# Patient Record
Sex: Female | Born: 1935 | Race: White | Hispanic: No | Marital: Married
Health system: Southern US, Community
[De-identification: ages and names within clinical notes are randomized; demographics above are authoritative.]

---

## 2004-12-28 ENCOUNTER — Other Ambulatory Visit: Payer: Self-pay

## 2004-12-28 ENCOUNTER — Inpatient Hospital Stay: Payer: Self-pay | Admitting: General Practice

## 2005-01-06 ENCOUNTER — Other Ambulatory Visit: Payer: Self-pay

## 2005-01-09 ENCOUNTER — Other Ambulatory Visit: Payer: Self-pay

## 2005-01-15 ENCOUNTER — Encounter: Payer: Self-pay | Admitting: Internal Medicine

## 2005-01-17 ENCOUNTER — Encounter: Payer: Self-pay | Admitting: Internal Medicine

## 2009-03-21 ENCOUNTER — Ambulatory Visit: Payer: Self-pay | Admitting: Internal Medicine

## 2009-03-30 ENCOUNTER — Emergency Department: Payer: Self-pay | Admitting: Emergency Medicine

## 2009-05-01 ENCOUNTER — Ambulatory Visit: Payer: Self-pay | Admitting: Internal Medicine

## 2009-05-03 ENCOUNTER — Ambulatory Visit: Payer: Self-pay | Admitting: Internal Medicine

## 2009-05-05 ENCOUNTER — Ambulatory Visit: Payer: Self-pay | Admitting: Internal Medicine

## 2009-05-08 ENCOUNTER — Ambulatory Visit: Payer: Self-pay | Admitting: Internal Medicine

## 2009-05-11 ENCOUNTER — Ambulatory Visit: Payer: Self-pay | Admitting: Internal Medicine

## 2009-05-15 ENCOUNTER — Ambulatory Visit: Payer: Self-pay | Admitting: Internal Medicine

## 2009-05-18 ENCOUNTER — Ambulatory Visit: Payer: Self-pay | Admitting: Internal Medicine

## 2009-05-22 ENCOUNTER — Ambulatory Visit: Payer: Self-pay | Admitting: Internal Medicine

## 2009-05-25 ENCOUNTER — Encounter: Payer: Self-pay | Admitting: Internal Medicine

## 2009-06-19 ENCOUNTER — Encounter: Payer: Self-pay | Admitting: Internal Medicine

## 2009-07-19 ENCOUNTER — Encounter: Payer: Self-pay | Admitting: Internal Medicine

## 2009-07-25 ENCOUNTER — Ambulatory Visit: Payer: Self-pay | Admitting: Internal Medicine

## 2009-08-01 ENCOUNTER — Ambulatory Visit: Payer: Self-pay | Admitting: Internal Medicine

## 2009-08-08 ENCOUNTER — Ambulatory Visit: Payer: Self-pay | Admitting: Internal Medicine

## 2009-08-15 ENCOUNTER — Ambulatory Visit: Payer: Self-pay | Admitting: Internal Medicine

## 2009-08-23 ENCOUNTER — Ambulatory Visit: Payer: Self-pay | Admitting: Internal Medicine

## 2009-09-01 ENCOUNTER — Ambulatory Visit: Payer: Self-pay | Admitting: Internal Medicine

## 2009-09-08 ENCOUNTER — Ambulatory Visit: Payer: Self-pay | Admitting: Internal Medicine

## 2009-09-15 ENCOUNTER — Ambulatory Visit: Payer: Self-pay | Admitting: Internal Medicine

## 2009-09-22 ENCOUNTER — Ambulatory Visit: Payer: Self-pay | Admitting: Internal Medicine

## 2009-10-04 ENCOUNTER — Ambulatory Visit: Payer: Self-pay | Admitting: Internal Medicine

## 2009-10-18 ENCOUNTER — Ambulatory Visit: Payer: Self-pay | Admitting: Internal Medicine

## 2009-11-01 ENCOUNTER — Ambulatory Visit: Payer: Self-pay | Admitting: Internal Medicine

## 2009-11-09 ENCOUNTER — Ambulatory Visit: Payer: Self-pay | Admitting: Internal Medicine

## 2009-11-14 ENCOUNTER — Ambulatory Visit: Payer: Self-pay | Admitting: Internal Medicine

## 2009-11-21 ENCOUNTER — Ambulatory Visit: Payer: Self-pay | Admitting: Internal Medicine

## 2009-12-26 ENCOUNTER — Ambulatory Visit: Payer: Self-pay | Admitting: Internal Medicine

## 2011-01-08 ENCOUNTER — Emergency Department: Payer: Self-pay | Admitting: Unknown Physician Specialty

## 2011-03-20 ENCOUNTER — Encounter: Payer: Self-pay | Admitting: Cardiothoracic Surgery

## 2011-03-20 ENCOUNTER — Encounter: Payer: Self-pay | Admitting: Nurse Practitioner

## 2011-09-12 ENCOUNTER — Emergency Department: Payer: Self-pay | Admitting: *Deleted

## 2011-09-12 LAB — URINALYSIS, COMPLETE
Bilirubin,UR: NEGATIVE
Blood: NEGATIVE
Glucose,UR: NEGATIVE mg/dL (ref 0–75)
Hyaline Cast: 23
Nitrite: NEGATIVE
Ph: 6 (ref 4.5–8.0)
Protein: 30
RBC,UR: 10 /HPF (ref 0–5)
Specific Gravity: 1.017 (ref 1.003–1.030)
Squamous Epithelial: 4

## 2011-09-12 LAB — CBC
MCHC: 33 g/dL (ref 32.0–36.0)
RDW: 15.2 % — ABNORMAL HIGH (ref 11.5–14.5)
WBC: 7.4 10*3/uL (ref 3.6–11.0)

## 2011-09-12 LAB — COMPREHENSIVE METABOLIC PANEL
Albumin: 3.5 g/dL (ref 3.4–5.0)
Anion Gap: 12 (ref 7–16)
BUN: 18 mg/dL (ref 7–18)
Bilirubin,Total: 0.7 mg/dL (ref 0.2–1.0)
Co2: 29 mmol/L (ref 21–32)
Glucose: 107 mg/dL — ABNORMAL HIGH (ref 65–99)
Potassium: 2.9 mmol/L — ABNORMAL LOW (ref 3.5–5.1)
SGOT(AST): 36 U/L (ref 15–37)
Total Protein: 7.7 g/dL (ref 6.4–8.2)

## 2011-09-12 LAB — TROPONIN I: Troponin-I: <0.02 ng/mL

## 2011-09-12 LAB — PROTIME-INR: INR: 1.4

## 2011-09-12 LAB — CK TOTAL AND CKMB (NOT AT ARMC)
CK, Total: 46 U/L (ref 21–215)
CK-MB: 1.7 ng/mL (ref 0.5–3.6)

## 2012-08-15 LAB — COMPREHENSIVE METABOLIC PANEL
Albumin: 3.3 g/dL — ABNORMAL LOW (ref 3.4–5.0)
Alkaline Phosphatase: 103 U/L (ref 50–136)
Anion Gap: 11 (ref 7–16)
BUN: 10 mg/dL (ref 7–18)
Calcium, Total: 8.4 mg/dL — ABNORMAL LOW (ref 8.5–10.1)
Chloride: 105 mmol/L (ref 98–107)
Glucose: 102 mg/dL — ABNORMAL HIGH (ref 65–99)
Potassium: 2.7 mmol/L — ABNORMAL LOW (ref 3.5–5.1)
SGOT(AST): 22 U/L (ref 15–37)
SGPT (ALT): 9 U/L — ABNORMAL LOW (ref 12–78)
Total Protein: 6.9 g/dL (ref 6.4–8.2)

## 2012-08-15 LAB — CBC
HGB: 11.9 g/dL — ABNORMAL LOW (ref 12.0–16.0)
MCH: 28 pg (ref 26.0–34.0)
MCHC: 34.2 g/dL (ref 32.0–36.0)
Platelet: 156 10*3/uL (ref 150–440)
RBC: 4.25 10*6/uL (ref 3.80–5.20)
RDW: 16.8 % — ABNORMAL HIGH (ref 11.5–14.5)
WBC: 13.5 10*3/uL — ABNORMAL HIGH (ref 3.6–11.0)

## 2012-08-16 ENCOUNTER — Inpatient Hospital Stay: Payer: Self-pay | Admitting: Internal Medicine

## 2012-08-16 LAB — BASIC METABOLIC PANEL
BUN: 9 mg/dL (ref 7–18)
Calcium, Total: 8 mg/dL — ABNORMAL LOW (ref 8.5–10.1)
Chloride: 105 mmol/L (ref 98–107)
Co2: 28 mmol/L (ref 21–32)
Co2: 27 mmol/L (ref 21–32)
Creatinine: 0.91 mg/dL (ref 0.60–1.30)
EGFR (African American): >60
Glucose: 169 mg/dL — ABNORMAL HIGH (ref 65–99)
Glucose: 96 mg/dL (ref 65–99)
Osmolality: 280 (ref 275–301)
Potassium: 3.4 mmol/L — ABNORMAL LOW (ref 3.5–5.1)
Sodium: 142 mmol/L (ref 136–145)

## 2012-08-16 LAB — URINALYSIS, COMPLETE
Bacteria: NONE SEEN
Bilirubin,UR: NEGATIVE
Glucose,UR: NEGATIVE mg/dL (ref 0–75)
Hyaline Cast: 8
Ketone: NEGATIVE
Leukocyte Esterase: NEGATIVE
Nitrite: NEGATIVE
Protein: 30
Specific Gravity: 1.025 (ref 1.003–1.030)
Squamous Epithelial: 3

## 2012-08-16 LAB — PRO B NATRIURETIC PEPTIDE: B-Type Natriuretic Peptide: 9161 pg/mL — ABNORMAL HIGH (ref 0–450)

## 2012-08-17 LAB — BASIC METABOLIC PANEL
Anion Gap: 8 (ref 7–16)
BUN: 9 mg/dL (ref 7–18)
Co2: 28 mmol/L (ref 21–32)
Creatinine: 0.87 mg/dL (ref 0.60–1.30)
EGFR (African American): >60
EGFR (Non-African Amer.): >60
Glucose: 84 mg/dL (ref 65–99)
Sodium: 141 mmol/L (ref 136–145)

## 2012-08-17 LAB — CBC WITH DIFFERENTIAL/PLATELET
Basophil %: 0.3 %
Eosinophil #: 0.1 10*3/uL (ref 0.0–0.7)
Eosinophil %: 1.3 %
HCT: 34.2 % — ABNORMAL LOW (ref 35.0–47.0)
HGB: 11.3 g/dL — ABNORMAL LOW (ref 12.0–16.0)
Lymphocyte #: 0.5 10*3/uL — ABNORMAL LOW (ref 1.0–3.6)
MCH: 27.1 pg (ref 26.0–34.0)
MCHC: 33.1 g/dL (ref 32.0–36.0)
MCV: 82 fL (ref 80–100)
Monocyte #: 1.2 x10 3/mm — ABNORMAL HIGH (ref 0.2–0.9)
Monocyte %: 18.2 %
Neutrophil #: 4.6 10*3/uL (ref 1.4–6.5)
Platelet: 133 10*3/uL — ABNORMAL LOW (ref 150–440)
RBC: 4.18 10*6/uL (ref 3.80–5.20)
RDW: 17.4 % — ABNORMAL HIGH (ref 11.5–14.5)

## 2012-08-17 LAB — MAGNESIUM: Magnesium: 1.9 mg/dL

## 2012-08-18 LAB — PROTIME-INR: Prothrombin Time: 30.8 secs — ABNORMAL HIGH (ref 11.5–14.7)

## 2012-08-19 LAB — BASIC METABOLIC PANEL
Anion Gap: 9 (ref 7–16)
BUN: 10 mg/dL (ref 7–18)
Calcium, Total: 8.6 mg/dL (ref 8.5–10.1)
Chloride: 101 mmol/L (ref 98–107)
Creatinine: 0.96 mg/dL (ref 0.60–1.30)
EGFR (African American): >60
Glucose: 140 mg/dL — ABNORMAL HIGH (ref 65–99)
Osmolality: 279 (ref 275–301)
Potassium: 3.8 mmol/L (ref 3.5–5.1)

## 2012-08-19 LAB — PROTIME-INR: INR: 3

## 2012-08-21 LAB — CULTURE, BLOOD (SINGLE)

## 2013-02-15 ENCOUNTER — Inpatient Hospital Stay: Payer: Self-pay | Admitting: Orthopedic Surgery

## 2013-02-15 LAB — URINALYSIS, COMPLETE
Bilirubin,UR: NEGATIVE
Blood: NEGATIVE
Glucose,UR: NEGATIVE mg/dL (ref 0–75)
Hyaline Cast: 8
Ketone: NEGATIVE
Nitrite: NEGATIVE
Ph: 6 (ref 4.5–8.0)
Protein: NEGATIVE
RBC,UR: 1 /HPF (ref 0–5)
Specific Gravity: 1.008 (ref 1.003–1.030)
Squamous Epithelial: NONE SEEN

## 2013-02-15 LAB — COMPREHENSIVE METABOLIC PANEL
Albumin: 3.4 g/dL (ref 3.4–5.0)
Anion Gap: 6 — ABNORMAL LOW (ref 7–16)
Calcium, Total: 8.7 mg/dL (ref 8.5–10.1)
Co2: 31 mmol/L (ref 21–32)
Creatinine: 0.79 mg/dL (ref 0.60–1.30)
EGFR (African American): >60
Glucose: 122 mg/dL — ABNORMAL HIGH (ref 65–99)
Potassium: 3.8 mmol/L (ref 3.5–5.1)
SGPT (ALT): 19 U/L (ref 12–78)
Sodium: 140 mmol/L (ref 136–145)
Total Protein: 6.8 g/dL (ref 6.4–8.2)

## 2013-02-15 LAB — PROTIME-INR
INR: 2.7
Prothrombin Time: 27.7 secs — ABNORMAL HIGH (ref 11.5–14.7)

## 2013-02-15 LAB — CBC WITH DIFFERENTIAL/PLATELET
Basophil #: 0 10*3/uL (ref 0.0–0.1)
Eosinophil #: 0.1 10*3/uL (ref 0.0–0.7)
Eosinophil %: 0.5 %
HCT: 33.8 % — ABNORMAL LOW (ref 35.0–47.0)
HGB: 11.1 g/dL — ABNORMAL LOW (ref 12.0–16.0)
Lymphocyte #: 0.7 10*3/uL — ABNORMAL LOW (ref 1.0–3.6)
MCH: 27.8 pg (ref 26.0–34.0)
MCV: 85 fL (ref 80–100)
Monocyte #: 2.2 x10 3/mm — ABNORMAL HIGH (ref 0.2–0.9)
Monocyte %: 19.6 %
Neutrophil #: 8.1 10*3/uL — ABNORMAL HIGH (ref 1.4–6.5)
RBC: 3.98 10*6/uL (ref 3.80–5.20)
RDW: 16.7 % — ABNORMAL HIGH (ref 11.5–14.5)
WBC: 11 10*3/uL (ref 3.6–11.0)

## 2013-02-15 LAB — T4, FREE: Free Thyroxine: 1.99 ng/dL — ABNORMAL HIGH (ref 0.76–1.46)

## 2013-02-15 LAB — TSH: Thyroid Stimulating Horm: 0.012 u[IU]/mL — ABNORMAL LOW

## 2013-02-16 ENCOUNTER — Ambulatory Visit: Payer: Self-pay | Admitting: Internal Medicine

## 2013-02-16 LAB — BASIC METABOLIC PANEL
BUN: 9 mg/dL (ref 7–18)
Chloride: 102 mmol/L (ref 98–107)
Creatinine: 0.88 mg/dL (ref 0.60–1.30)
EGFR (African American): >60
Glucose: 109 mg/dL — ABNORMAL HIGH (ref 65–99)
Potassium: 3.8 mmol/L (ref 3.5–5.1)
Sodium: 139 mmol/L (ref 136–145)

## 2013-02-16 LAB — CBC WITH DIFFERENTIAL/PLATELET
Basophil #: 0 10*3/uL (ref 0.0–0.1)
Basophil %: 0.1 %
Eosinophil #: 0 10*3/uL (ref 0.0–0.7)
Eosinophil %: 0.2 %
Lymphocyte %: 2.9 %
MCH: 28.2 pg (ref 26.0–34.0)
MCHC: 33.1 g/dL (ref 32.0–36.0)
MCV: 85 fL (ref 80–100)
Monocyte #: 3.8 x10 3/mm — ABNORMAL HIGH (ref 0.2–0.9)
Monocyte %: 26.4 %
Platelet: 155 10*3/uL (ref 150–440)
RBC: 3.42 10*6/uL — ABNORMAL LOW (ref 3.80–5.20)
RDW: 16.3 % — ABNORMAL HIGH (ref 11.5–14.5)
WBC: 14.3 10*3/uL — ABNORMAL HIGH (ref 3.6–11.0)

## 2013-02-16 LAB — URINALYSIS, COMPLETE
Bilirubin,UR: NEGATIVE
Ketone: NEGATIVE
Nitrite: NEGATIVE
Ph: 5 (ref 4.5–8.0)
Protein: 100
Specific Gravity: 1.017 (ref 1.003–1.030)
Squamous Epithelial: NONE SEEN

## 2013-02-16 LAB — PROTIME-INR
INR: 2.3
INR: 1.6
Prothrombin Time: 19.2 secs — ABNORMAL HIGH (ref 11.5–14.7)

## 2013-02-17 LAB — CBC WITH DIFFERENTIAL/PLATELET
Bands: 1 %
Lymphocytes: 1 %
MCHC: 33.7 g/dL (ref 32.0–36.0)
MCV: 85 fL (ref 80–100)
Monocytes: 15 %
Platelet: 172 10*3/uL (ref 150–440)
RBC: 3.17 10*6/uL — ABNORMAL LOW (ref 3.80–5.20)
Segmented Neutrophils: 83 %
WBC: 19.3 10*3/uL — ABNORMAL HIGH (ref 3.6–11.0)

## 2013-02-17 LAB — PROTIME-INR
INR: 1.7
INR: 1.5
Prothrombin Time: 19.7 secs — ABNORMAL HIGH (ref 11.5–14.7)
Prothrombin Time: 17.8 secs — ABNORMAL HIGH (ref 11.5–14.7)

## 2013-02-17 LAB — BASIC METABOLIC PANEL
BUN: 15 mg/dL (ref 7–18)
Co2: 25 mmol/L (ref 21–32)
Creatinine: 1.21 mg/dL (ref 0.60–1.30)
Glucose: 88 mg/dL (ref 65–99)

## 2013-02-18 LAB — BASIC METABOLIC PANEL
Anion Gap: 11 (ref 7–16)
Chloride: 105 mmol/L (ref 98–107)
Co2: 27 mmol/L (ref 21–32)
EGFR (African American): 47 — ABNORMAL LOW
EGFR (Non-African Amer.): 40 — ABNORMAL LOW
Glucose: 93 mg/dL (ref 65–99)
Osmolality: 289 (ref 275–301)

## 2013-02-18 LAB — CBC WITH DIFFERENTIAL/PLATELET
HGB: 7.7 g/dL — ABNORMAL LOW (ref 12.0–16.0)
MCH: 28.8 pg (ref 26.0–34.0)
Monocytes: 15 %
Platelet: 147 10*3/uL — ABNORMAL LOW (ref 150–440)
RBC: 2.69 10*6/uL — ABNORMAL LOW (ref 3.80–5.20)
Segmented Neutrophils: 84 %
WBC: 20.5 10*3/uL — ABNORMAL HIGH (ref 3.6–11.0)

## 2013-02-18 LAB — PROTIME-INR: INR: 2

## 2013-02-19 LAB — CBC WITH DIFFERENTIAL/PLATELET
Bands: 1 %
Basophil #: 0 10*3/uL (ref 0.0–0.1)
Basophil %: 0.1 %
Eosinophil #: 0.1 10*3/uL (ref 0.0–0.7)
Eosinophil: 1 %
HGB: 9.5 g/dL — ABNORMAL LOW (ref 12.0–16.0)
Lymphocyte %: 4.3 %
MCH: 30.2 pg (ref 26.0–34.0)
MCH: 29.5 pg (ref 26.0–34.0)
MCHC: 34.2 g/dL (ref 32.0–36.0)
MCHC: 33.3 g/dL (ref 32.0–36.0)
MCV: 88 fL (ref 80–100)
Monocyte #: 5.5 x10 3/mm — ABNORMAL HIGH (ref 0.2–0.9)
Monocyte %: 29.8 %
Neutrophil #: 12 10*3/uL — ABNORMAL HIGH (ref 1.4–6.5)
Neutrophil %: 65.5 %
Platelet: 154 10*3/uL (ref 150–440)
Platelet: 191 10*3/uL (ref 150–440)
RBC: 3.31 10*6/uL — ABNORMAL LOW (ref 3.80–5.20)
RDW: 17.2 % — ABNORMAL HIGH (ref 11.5–14.5)
RDW: 17 % — ABNORMAL HIGH (ref 11.5–14.5)
Segmented Neutrophils: 66 %
WBC: 18.5 10*3/uL — ABNORMAL HIGH (ref 3.6–11.0)
WBC: 22.6 10*3/uL — ABNORMAL HIGH (ref 3.6–11.0)

## 2013-02-19 LAB — PROTIME-INR: Prothrombin Time: 20 secs — ABNORMAL HIGH (ref 11.5–14.7)

## 2013-02-19 LAB — APTT: Activated PTT: 36.3 secs — ABNORMAL HIGH (ref 23.6–35.9)

## 2013-02-20 LAB — CBC WITH DIFFERENTIAL/PLATELET
Basophil %: 0.2 %
Eosinophil %: 0.3 %
HCT: 28.9 % — ABNORMAL LOW (ref 35.0–47.0)
HGB: 9.5 g/dL — ABNORMAL LOW (ref 12.0–16.0)
MCH: 29.3 pg (ref 26.0–34.0)
MCHC: 33 g/dL (ref 32.0–36.0)
MCV: 89 fL (ref 80–100)
Monocyte %: 27.8 %
Neutrophil #: 16.6 10*3/uL — ABNORMAL HIGH (ref 1.4–6.5)
Platelet: 211 10*3/uL (ref 150–440)
RDW: 17.5 % — ABNORMAL HIGH (ref 11.5–14.5)

## 2013-02-20 LAB — URINALYSIS, COMPLETE
Bacteria: NONE SEEN
Bilirubin,UR: NEGATIVE
Hyaline Cast: 3
Ketone: NEGATIVE
Protein: NEGATIVE
Specific Gravity: 1.012 (ref 1.003–1.030)
WBC UR: <1 /HPF (ref 0–5)

## 2013-02-20 LAB — PROTIME-INR: INR: 1.3

## 2013-02-20 LAB — APTT: Activated PTT: >160 secs (ref 23.6–35.9)

## 2013-02-20 LAB — CLOSTRIDIUM DIFFICILE BY PCR

## 2013-02-21 LAB — CBC WITH DIFFERENTIAL/PLATELET
Bands: 1 %
Comment - H1-Com3: NORMAL
HCT: 26.7 % — ABNORMAL LOW (ref 35.0–47.0)
HGB: 8 g/dL — ABNORMAL LOW (ref 12.0–16.0)
HGB: 9 g/dL — ABNORMAL LOW (ref 12.0–16.0)
Lymphocytes: 11 %
MCH: 30 pg (ref 26.0–34.0)
MCHC: 33.6 g/dL (ref 32.0–36.0)
MCV: 89 fL (ref 80–100)
Metamyelocyte: 1 %
Monocytes: 13 %
Myelocyte: 1 %
Myelocyte: 2 %
Platelet: 159 10*3/uL (ref 150–440)
Platelet: 195 10*3/uL (ref 150–440)
RDW: 18 % — ABNORMAL HIGH (ref 11.5–14.5)
RDW: 18 % — ABNORMAL HIGH (ref 11.5–14.5)
Segmented Neutrophils: 74 %
WBC: 10.9 10*3/uL (ref 3.6–11.0)
WBC: 14.1 10*3/uL — ABNORMAL HIGH (ref 3.6–11.0)

## 2013-02-21 LAB — APTT
Activated PTT: 122.5 secs — ABNORMAL HIGH (ref 23.6–35.9)
Activated PTT: 45.2 secs — ABNORMAL HIGH (ref 23.6–35.9)

## 2013-02-22 LAB — CBC WITH DIFFERENTIAL/PLATELET
Bands: 1 %
Eosinophil: 2 %
HCT: 24 % — ABNORMAL LOW (ref 35.0–47.0)
HGB: 7.9 g/dL — ABNORMAL LOW (ref 12.0–16.0)
Lymphocytes: 14 %
MCH: 29.3 pg (ref 26.0–34.0)
MCHC: 33 g/dL (ref 32.0–36.0)
MCV: 89 fL (ref 80–100)
NRBC/100 WBC: 1 /
Platelet: 166 10*3/uL (ref 150–440)
RBC: 2.7 10*6/uL — ABNORMAL LOW (ref 3.80–5.20)
WBC: 11.8 10*3/uL — ABNORMAL HIGH (ref 3.6–11.0)

## 2013-02-22 LAB — BASIC METABOLIC PANEL
Calcium, Total: 7.8 mg/dL — ABNORMAL LOW (ref 8.5–10.1)
Co2: 34 mmol/L — ABNORMAL HIGH (ref 21–32)
Creatinine: 0.75 mg/dL (ref 0.60–1.30)
EGFR (African American): >60
Glucose: 97 mg/dL (ref 65–99)
Osmolality: 279 (ref 275–301)
Sodium: 139 mmol/L (ref 136–145)

## 2013-02-22 LAB — PROTIME-INR: INR: 1.3

## 2013-02-23 LAB — CBC WITH DIFFERENTIAL/PLATELET
Basophil #: 0 10*3/uL (ref 0.0–0.1)
Basophil %: 0.1 %
Eosinophil %: 0.6 %
HCT: 26 % — ABNORMAL LOW (ref 35.0–47.0)
HGB: 8.6 g/dL — ABNORMAL LOW (ref 12.0–16.0)
Lymphocyte %: 7.2 %
MCHC: 32.9 g/dL (ref 32.0–36.0)
Monocyte #: 3.2 x10 3/mm — ABNORMAL HIGH (ref 0.2–0.9)
Monocyte %: 23.3 %
Neutrophil %: 68.8 %
RDW: 18.8 % — ABNORMAL HIGH (ref 11.5–14.5)
WBC: 13.8 10*3/uL — ABNORMAL HIGH (ref 3.6–11.0)

## 2013-02-23 LAB — PATHOLOGY REPORT

## 2013-02-23 LAB — PROTIME-INR: Prothrombin Time: 17 secs — ABNORMAL HIGH (ref 11.5–14.7)

## 2013-02-24 ENCOUNTER — Encounter: Payer: Self-pay | Admitting: Internal Medicine

## 2013-02-25 ENCOUNTER — Ambulatory Visit: Payer: Self-pay | Admitting: Internal Medicine

## 2013-02-25 LAB — PROTIME-INR
INR: 1.5
Prothrombin Time: 17.9 secs — ABNORMAL HIGH (ref 11.5–14.7)

## 2013-02-25 LAB — CULTURE, BLOOD (SINGLE)

## 2013-02-26 ENCOUNTER — Ambulatory Visit: Payer: Self-pay | Admitting: Gerontology

## 2013-02-26 LAB — CBC WITH DIFFERENTIAL/PLATELET
HCT: 30.1 % — ABNORMAL LOW (ref 35.0–47.0)
Lymphocytes: 3 %
MCH: 28.5 pg (ref 26.0–34.0)
MCHC: 31.8 g/dL — ABNORMAL LOW (ref 32.0–36.0)
NRBC/100 WBC: 1 /
Segmented Neutrophils: 68 %
Variant Lymphocyte - H1-Rlymph: 2 %
WBC: 18.7 10*3/uL — ABNORMAL HIGH (ref 3.6–11.0)

## 2013-02-26 LAB — COMPREHENSIVE METABOLIC PANEL
Albumin: 2.8 g/dL — ABNORMAL LOW (ref 3.4–5.0)
Alkaline Phosphatase: 151 U/L — ABNORMAL HIGH (ref 50–136)
Bilirubin,Total: 1.5 mg/dL — ABNORMAL HIGH (ref 0.2–1.0)
Calcium, Total: 8.7 mg/dL (ref 8.5–10.1)
Co2: 33 mmol/L — ABNORMAL HIGH (ref 21–32)
Creatinine: 0.86 mg/dL (ref 0.60–1.30)
EGFR (African American): >60
EGFR (Non-African Amer.): >60
Osmolality: 270 (ref 275–301)
Potassium: 3.7 mmol/L (ref 3.5–5.1)
SGOT(AST): 36 U/L (ref 15–37)
Total Protein: 6.3 g/dL — ABNORMAL LOW (ref 6.4–8.2)

## 2013-02-26 LAB — URINALYSIS, COMPLETE
Bilirubin,UR: NEGATIVE
Blood: NEGATIVE
Hyaline Cast: 48
Ketone: NEGATIVE
RBC,UR: 1 /HPF (ref 0–5)
Specific Gravity: 1.018 (ref 1.003–1.030)
WBC UR: 10 /HPF (ref 0–5)

## 2013-02-26 LAB — PROTIME-INR
INR: 1.3
Prothrombin Time: 16.4 secs — ABNORMAL HIGH (ref 11.5–14.7)

## 2013-03-02 LAB — PROTIME-INR: INR: 2.3

## 2013-03-09 LAB — PROTIME-INR: INR: 4.1

## 2013-03-15 LAB — PROTIME-INR: Prothrombin Time: 40.6 secs — ABNORMAL HIGH (ref 11.5–14.7)

## 2013-03-18 LAB — PROTIME-INR: INR: 2.4

## 2013-03-19 ENCOUNTER — Ambulatory Visit: Payer: Self-pay | Admitting: Internal Medicine

## 2013-03-21 LAB — COMPREHENSIVE METABOLIC PANEL
Anion Gap: 6 — ABNORMAL LOW (ref 7–16)
Calcium, Total: 8.2 mg/dL — ABNORMAL LOW (ref 8.5–10.1)
Chloride: 101 mmol/L (ref 98–107)
Co2: 33 mmol/L — ABNORMAL HIGH (ref 21–32)
Creatinine: 0.59 mg/dL — ABNORMAL LOW (ref 0.60–1.30)
Glucose: 91 mg/dL (ref 65–99)
Osmolality: 277 (ref 275–301)

## 2013-03-21 LAB — CBC WITH DIFFERENTIAL/PLATELET
Basophil #: 0 10*3/uL (ref 0.0–0.1)
Basophil %: 0.6 %
Eosinophil #: 0.1 10*3/uL (ref 0.0–0.7)
Eosinophil %: 0.7 %
HGB: 11.6 g/dL — ABNORMAL LOW (ref 12.0–16.0)
Lymphocyte %: 9.5 %
MCH: 30.6 pg (ref 26.0–34.0)
MCHC: 33.4 g/dL (ref 32.0–36.0)
MCV: 91 fL (ref 80–100)
Monocyte #: 2 x10 3/mm — ABNORMAL HIGH (ref 0.2–0.9)
Monocyte %: 27.1 %
Neutrophil #: 4.6 10*3/uL (ref 1.4–6.5)
Neutrophil %: 62.1 %
Platelet: 210 10*3/uL (ref 150–440)
RBC: 3.81 10*6/uL (ref 3.80–5.20)
WBC: 7.3 10*3/uL (ref 3.6–11.0)

## 2013-03-21 LAB — PROTIME-INR
INR: 2.5
Prothrombin Time: 26.7 secs — ABNORMAL HIGH (ref 11.5–14.7)

## 2013-03-22 ENCOUNTER — Observation Stay: Payer: Self-pay | Admitting: Internal Medicine

## 2013-03-22 LAB — URINALYSIS, COMPLETE
Bilirubin,UR: NEGATIVE
Glucose,UR: NEGATIVE mg/dL (ref 0–75)
Ph: 5 (ref 4.5–8.0)
Protein: 30
RBC,UR: 58 /HPF (ref 0–5)
Specific Gravity: 1.029 (ref 1.003–1.030)
Squamous Epithelial: 1
WBC UR: 22 /HPF (ref 0–5)

## 2013-03-22 LAB — TSH: Thyroid Stimulating Horm: 42.4 u[IU]/mL — ABNORMAL HIGH

## 2013-03-22 LAB — LIPASE, BLOOD: Lipase: 124 U/L (ref 73–393)

## 2013-03-24 ENCOUNTER — Encounter: Payer: Self-pay | Admitting: Internal Medicine

## 2013-03-25 LAB — BASIC METABOLIC PANEL
Anion Gap: 4 — ABNORMAL LOW (ref 7–16)
BUN: 8 mg/dL (ref 7–18)
Calcium, Total: 8.3 mg/dL — ABNORMAL LOW (ref 8.5–10.1)
Chloride: 105 mmol/L (ref 98–107)
Co2: 30 mmol/L (ref 21–32)
EGFR (African American): >60
EGFR (Non-African Amer.): >60
Glucose: 72 mg/dL (ref 65–99)
Osmolality: 274 (ref 275–301)
Potassium: 3.9 mmol/L (ref 3.5–5.1)
Sodium: 139 mmol/L (ref 136–145)

## 2013-03-25 LAB — TSH: Thyroid Stimulating Horm: 43.6 u[IU]/mL — ABNORMAL HIGH

## 2013-03-25 LAB — PROTIME-INR: INR: 2.8

## 2013-03-26 ENCOUNTER — Ambulatory Visit: Payer: Self-pay | Admitting: Gerontology

## 2013-03-29 LAB — URINALYSIS, COMPLETE
Bilirubin,UR: NEGATIVE
Blood: NEGATIVE
Glucose,UR: NEGATIVE mg/dL (ref 0–75)
Leukocyte Esterase: NEGATIVE
Nitrite: NEGATIVE
Ph: 5 (ref 4.5–8.0)
Protein: 30
RBC,UR: NONE SEEN /HPF (ref 0–5)
Specific Gravity: 1.024 (ref 1.003–1.030)
Squamous Epithelial: NONE SEEN

## 2013-04-01 LAB — PROTIME-INR: INR: 7.5

## 2013-04-01 LAB — TSH: Thyroid Stimulating Horm: 42 u[IU]/mL — ABNORMAL HIGH

## 2013-04-02 LAB — CBC WITH DIFFERENTIAL/PLATELET
Basophil #: 0 10*3/uL (ref 0.0–0.1)
Basophil %: 0.4 %
Eosinophil #: 0 10*3/uL (ref 0.0–0.7)
Eosinophil %: 0.9 %
HCT: 33.8 % — ABNORMAL LOW (ref 35.0–47.0)
HGB: 11.3 g/dL — ABNORMAL LOW (ref 12.0–16.0)
Lymphocyte #: 0.5 10*3/uL — ABNORMAL LOW (ref 1.0–3.6)
MCH: 30.7 pg (ref 26.0–34.0)
MCV: 92 fL (ref 80–100)
Monocyte %: 22.5 %
Neutrophil %: 65.6 %
Platelet: 276 10*3/uL (ref 150–440)
WBC: 4.8 10*3/uL (ref 3.6–11.0)

## 2013-04-02 LAB — PROTIME-INR
INR: >15.1
Prothrombin Time: >120 secs (ref 11.5–14.7)

## 2013-04-03 LAB — PROTIME-INR: INR: 3.2

## 2013-04-05 LAB — PROTIME-INR: INR: 2.6

## 2013-04-19 DEATH — deceased

## 2014-12-06 NOTE — H&P (Signed)
PATIENT NAME:  Christy Webster, Christy Webster MR#:  [id number] DATE OF BIRTH:  1929-02-28  DATE OF ADMISSION:  08/16/2012  PRIMARY CARE PHYSICIAN:  Bethann PunchesMark Miller, MD.  REFERRING PHYSICIAN:  Lucrezia EuropeWebster Allison, MD.  CHIEF COMPLAINT: Cough, congestion, shortness of breath.   HISTORY OF PRESENT ILLNESS: This is an 81 year old female with significant past medical history of hypertension, hyperlipidemia, atrial fibrillation on warfarin, history of aortic valve replacement in 1989 with St. Jude valve in [place], with known history of diastolic CHF.  Has been managed by Dr. Gwen PoundsKowalski with recent echo done in November of this year showing ejection fraction of 70% with severe biatrial enlargement and moderate to severe pulmonary hypertension. The patient presents with complaints of cough, congestion and shortness of breath. The patient reports she has been coughing for a few days and feeling thick secretions that she cannot cough out. As well she complains of shortness of breath which she reports is mainly chronic exertional.  As well she complaints of worsening lower extremity edema. The patient's chest x-ray did not show any infiltrate, but did show evidence of cardiomegaly with increased vascular congestion. The patient had significantly elevated proBNP at 9161. As well she did have significant hypokalemia with potassium of 2.7.  The patient as well had low O2 sat at 92% on room air, currently improved at 97% on 2 liters nasal cannula. The patient does not use oxygen at home and does not have history of COPD in the past. The patient denies any chest pain, any vomiting, any nausea, any coffee-ground emesis. The patient reports her shortness of breath improved after receiving IV Lasix.  Hospitalist service was requested to admit the patient for further management and work-up of her congestive heart failure.   PAST MEDICAL HISTORY:  1.  Arthritis. 2.  Hypertension.  3.  Osteoporosis.  4.  Right hip replacement in 2006.  5.   Hyperlipidemia.  6.  Aortic valve replacement with St. Jude valve  in 1989 in [place]. 7.  Hypothyroidism.  8.  Congestive heart failure with ejection fraction of 70% with diastolic dysfunction.   SOCIAL HISTORY: The patient does not smoke, drinks wine occasionally. Lives at assisted living facility.   FAMILY HISTORY: Significant for coronary artery disease.   ALLERGIES: No known drug allergies.   HOME MEDICATIONS: 1.  Pravastatin 40 mg oral daily. 2.  Synthroid 125 mcg oral daily.  3.  Bisoprolol 5 mg oral daily.  4.  Folic acid 1 mg oral daily.  5.  Dyazide 37.5/25 mg half capsule daily.  6.  Multivitamin 1 tablet daily.  7.  Vitamin D3, 2000 units oral daily.  8.  Remeron 15 mg oral at bedtime.  9.  Lasix 20 mg oral daily.   REVIEW OF SYSTEMS:  The patient denies any fever or chills. Complains of fatigue and weakness.  EYES: Denies blurry vision, double vision or pain.  ENT: Denies tinnitus, ear pain, hearing loss, epistaxis or discharge.  RESPIRATORY: Complains of cough. Denies any wheezing, hemoptysis, painful respirations. Has complaints of dyspnea.  CARDIOVASCULAR: Denies chest pain, orthopnea, palpitations, syncope. Has complaints of lower extremity edema.  GASTROINTESTINAL: Denies nausea, vomiting, diarrhea, abdominal pain, hematemesis, melena.  GENITOURINARY: Denies dysuria, hematuria, renal colic.  ENDOCRINE:  Denies polyuria, polydipsia, heat or cold intolerance.  HEMATOLOGY: Denies anemia, easy bruising, bleeding diathesis.  INTEGUMENTARY: Denies any acne, rash or skin lesions. Complains of easy bruising.  MUSCULOSKELETAL: Complains of lower back pain and arthritis. Denies any cramps or gout.  NEUROLOGIC: Denies  ataxia, dementia, migraine, CVA or seizures.  PSYCHIATRIC: Denies anxiety, insomnia, schizophrenia or bipolar disorder.   PHYSICAL EXAMINATION: VITAL SIGNS: Temperature 97.8, pulse 98, respiratory rate 17, blood pressure 168/99, saturating 98% on 2  liters nasal cannula.  GENERAL: Frail, elderly female who looks comfortable in bed in no apparent distress.  HEENT: Head atraumatic, normocephalic. Pupils are to light. Pink conjunctivae. Anicteric sclerae. Moist oral mucosa.  NECK: Supple. No thyromegaly. No JVD.  CHEST: Good air entry bilaterally. No wheezing, rales, rhonchi.  CARDIOVASCULAR: S1, S2 heard with mild systolic murmur present with prosthetic heart sound.  ABDOMEN: Soft, nontender, nondistended. Bowel sounds present.  EXTREMITIES: +2 edema bilaterally. No clubbing. No cyanosis. Good pulses bilaterally.  NEUROLOGIC: Awake, alert x 3. Intact judgment and insight. Motor 5/5 in all extremities.  PSYCHIATRIC: Appropriate affect x 3.  Intact judgment and insight.  SKIN: Has multiple senile purpura.  Normal skin turgor. Warm and dry.   LABORATORY AND DIAGNOSTIC DATA:  Glucose 102. ProBNP 9161. BUN 10, creatinine 0.92, sodium 141, potassium 2.7, chloride 105, CO2 25,  anion gap 11.  White blood cells 13.5, hemoglobin 11.9, hematocrit 34.7, platelets 156. Urinalysis negative. EKG showing no changes from previous EKG with right bundle branch block which is old, showing irregular rhythm,  most likely A. fib.   ASSESSMENT AND PLAN: This is an 81 year old female who presents with complaint of cough, congestion, shortness of breath due to acute congestive heart failure and acute bronchitis.   1.  Acute congestive heart failure. The patient had recent echo showing ejection fraction of 70% with diastolic dysfunction. The patient will be started on IV Lasix.  Will be on strict ins and outs with daily weights and p.r.n. oxygen. Will cycle the patient's troponin and will consult Dr. Gwen Pounds as the patient is known to him.   2.  Hypokalemia. Will replace. Will recheck in a.Webster.  3.  History of atrial fibrillation. Currently the patient is on anticoagulation with INR of 2.7.  4.  Acute bronchitis.  The patient will be started on Augmentin and Mucinex.   5.  History of hypertension, uncontrolled. We will resume home medication.  6.  Hyperlipidemia. Continue with statin.  7.  Hypothyroidism. Continue with Synthroid.  8.  Deep vein thrombosis prophylaxis. The patient is on full dose anticoagulation with warfarin, no need for further chemical anticoagulation.   CODE STATUS: The patient is full code.   Total time spent on admission and patient care: 55 minutes.    ____________________________ Starleen Arms, MD dse:ct D: 08/16/2012 03:28:27 ET T: 08/17/2012 07:34:10 ET JOB#: [id number]  cc: Starleen Arms, MD, <Dictator> [place] Teena Irani MD ELECTRONICALLY SIGNED 08/18/2012 7:21

## 2014-12-09 NOTE — H&P (Signed)
PATIENT NAME:  Christy Webster, Christy Webster MR#:  [id number] DATE OF BIRTH:  Dec 22, 1928  DATE OF ADMISSION:  03/22/2013  PRIMARY CARE PHYSICIAN: Dr. Bethann PunchesMark Miller.   REFERRING PHYSICIAN: Noelle McLaurin.   CHIEF COMPLAINT: The patient referred from the rehabilitation center with complaint of constipation, abdominal pain and vomiting.   HISTORY OF PRESENT ILLNESS: Mrs. Christy Webster is an 81 year old Caucasian female recently admitted to the hospital on June 30th and discharged on July 8th when she sustained mechanical fall, resulting in left femoral neck fracture. This was operated on with left total hip replacement. The patient was sent for rehabilitation and placed on narcotics for pain control. She is receiving oxycodone in addition to fentanyl patch. The patient is complaining of constipation, and yesterday, she started to have abdominal pain associated with vomiting. The patient was brought here to the Emergency Department for evaluation, and CAT scan revealed severe constipation with fecal impaction. Digital disimpaction by Emergency Room physician had failed since she could not reach the impacted site. The patient is now in the process to be admitted for observation and to start treating her problem.   REVIEW OF SYSTEMS:  CONSTITUTIONAL: Denies having any fever. No chills. No fatigue.  EYES: No blurring of vision. No double vision.  EARS, NOSE, THROAT: She has hearing impairment, and it was difficult to communicate since she forgot to bring her hearing aid. No sore throat. No dysphagia.  CARDIOVASCULAR: No chest pain. No shortness of breath. No syncope.  RESPIRATORY: No shortness of breath. No cough. No chest pain.  GASTROINTESTINAL: Reports abdominal pain at the central abdominal area and also on the right side of the abdomen. Right now, this has resolved after having a few bowel movements. No melena or hematochezia. She had a couple of episodes of vomiting earlier, but right now, she feels relief. No  hematemesis.  GENITOURINARY: No dysuria. No frequency of urination.  MUSCULOSKELETAL: Apart from her hip pain, she has no other joint pain or swelling. No muscular pain or swelling.  INTEGUMENTARY: No skin rash. No ulcers.  NEUROLOGY: No focal weakness. No seizure activity. No headache.  PSYCHIATRY: She has some degree of anxiety and mild depression. This is by history, but right now, she feels fine.  ENDOCRINE: No polyuria or polydipsia. No heat or cold intolerance.   PAST MEDICAL HISTORY: Recent admission on June 30th and discharged on July 8th with left hip fracture, underwent left hip replacement. History of aortic valve stenosis, underwent aortic valve replacement with St. Jude's valve in 1989. Chronic anticoagulation with Coumadin, hyperlipidemia, systemic hypertension, hypothyroidism, osteoarthritis and past history of diabetes.   PAST SURGICAL HISTORY: Recent admission with left hip fracture, underwent left hip arthroplasty. Right hip replacement in 2006. Aortic valve replacement in 1989.   SOCIAL HABITS: Nonsmoker. No history of alcohol or drug abuse.   FAMILY HISTORY: Her father died from complications of alcoholism. Her mother died at the age of 79 after having a heart attack. She had a daughter who had also aortic stenosis complicated by congestive heart failure, and she died at the age of 79.   SOCIAL HISTORY: She is widowed. Lives at home alone, but right now, she resides at the rehabilitation center after the hip fracture, awaiting full recovery.   ADMISSION MEDICATIONS: Warfarin 3.5 mg a day, vitamin E, vitamin D and vitamin C supplementation, tramadol 50 mg q.6 hours p.r.n., fentanyl patch 25 microgram q.72 hours, oxycodone 5 mg q.4 hours p.r.n., mirtazapine 15 mg at bedtime, Seroquel 12.5 mg in the  morning and 25 mg in the evening, levothyroxine 75 mcg once a day, furosemide 20 mg a day, potassium supplementation 20 mEq a day, Ensure Plus twice a day, Colace once a day, bisoprolol  5 mg a day, Maalox Plus 30 mL q.6 hours p.r.n.   ALLERGIES: No known drug allergies.   PHYSICAL EXAMINATION:  VITAL SIGNS: Blood pressure 117/75, respiratory rate 18, pulse 86, temperature 98.7, oxygen saturation 94%.  GENERAL APPEARANCE: Elderly female sitting on the stretcher in no acute distress.  HEAD AND NECK: No pallor. No icterus. No cyanosis. Ear examination revealed impaired hearing, no discharge, no lesions. Examination of the nose showed no discharge, no bleeding, no ulcers. Oropharyngeal examination revealed normal lips and tongue. She is edentulous and wearing her dentures. No oral thrush. Eye examination revealed normal eyelids and conjunctivae. Pupils about 4 mm, round, equal and reactive to light. Neck is supple. Trachea at midline. No thyromegaly. No masses. No carotid bruits.  HEART: Regular S1 and S2. There is mid systolic murmur at the left sternal border grade 3/6.    RESPIRATORY: Normal breathing pattern without use of accessory muscles. No rales. No wheezing.  ABDOMEN: Soft without tenderness. No hepatosplenomegaly. No masses. No hernias.  SKIN: No ulcers. No subcutaneous nodules.  MUSCULOSKELETAL: No joint swelling. No clubbing.  NEUROLOGIC: Cranial nerves II through XII were intact. No focal motor deficit.  PSYCHIATRIC: The patient is alert and oriented x3. Mood and affect were normal.   LABORATORY FINDINGS: EKG showed atrial fibrillation with a controlled rate at 84 per minute. Right bundle branch block. Left axis deviation. Nonspecific ST-T wave abnormalities. CAT scan of the abdomen and pelvis without contrast showed bilateral inferior pubic ramus fracture. This appears to be acute or subacute. Cirrhotic liver. Large amount of stool in the rectum. Left inguinal hernia. Evidence of previous cholecystectomy. Serum glucose 91, BUN 6, creatinine 0.5, sodium 140, potassium 3.3. Normal liver transaminases. Bilirubin 1, alkaline phosphatase 580. CBC showed white count of 7000,  hemoglobin 11, hematocrit 34, platelet count 210. Prothrombin time 26. INR 2.5.   ASSESSMENT:  1. Abdominal pain associated with vomiting secondary to constipation.  2. Fecal impaction.  3. Mild hypokalemia.  4. Recent admission on June 30th and discharged on July 8th after a mechanical fall, sustaining left hip fracture, operated on with left hip arthroplasty.  5. Evidence of bilateral inferior pubic ramus fracture. This appears either acute or subacute. This could be from her recent fall; however, I did not see notification about this finding in the note of Dr. Lenard Forth in his note of June 30th.  6. Aortic valve replacement with St. Jude mechanical valve in 1989.  7. Chronic anticoagulation with Coumadin.  8. Hypothyroidism.  9. Systemic hypertension.  10. Hyperlipidemia.  11. Osteoporosis.   PLAN: Will admit the patient and continue treatment of fecal impaction. Use MiraLAX and Colace and 1 dose of milk of magnesia. From below, will use soap suds enema. The patient has already started to have some response, and her abdominal pain is resolving. Her nausea and vomiting have improved as well. Potassium replacement. The Emergency Room physician reported that by digital examination there was slightly mild heme-positive stool, but the patient does not have melena or blood per rectum. I will double check her hemoglobin in the morning. I will reconsult orthopedic surgery to take a look at the pelvic fracture. This is likely from her recent fall and most likely will be treated conservatively. Continue home medications as listed above. For deep vein  thrombosis prophylaxis, the patient is already on Coumadin. The patient indicates that she does not have a Living Will. She gave the power of attorney to her son. In the records and with the patient's official papers, she has DNR status; however, she tells me that she wants to be FULL CODE. Therefore, her code status is FULL CODE for the time being.   Time spent  in evaluating this patient and reviewing medical records and discussion with her and with her caregiver who was in her room took more than 1 hour.   ____________________________ Carney Corners. Rudene Re, MD amd:gb D: 03/22/2013 01:55:20 ET T: 03/22/2013 02:31:12 ET JOB#: [id number]  cc: Carney Corners. Rudene Re, MD, <Dictator> Karolee Ohs Dala Dock MD ELECTRONICALLY SIGNED 03/23/2013 7:01

## 2014-12-09 NOTE — Discharge Summary (Signed)
PATIENT NAME:  Christy Webster, Christy Webster MR#:  [id number] DATE OF BIRTH:  07/26/1929  DATE OF ADMISSION:  08/16/2012 DATE OF DISCHARGE:  08/18/2012  DISCHARGE DIAGNOSES:  1. Acute on chronic systolic dysfunction with congestive heart failure. 2. Aortic valve replacement status post a St. Jude valve in 1989. 3. Hypertension.  4. Hypothyroidism.   HISTORY: This is an 81 year old female who had had new onset of shortness of breath, weakness and fatigue most consistent with acute on chronic systolic congestive heart failure. The patient has had a recent echocardiogram showing systolic dysfunction and normal mitral valve prosthesis function. The patient, therefore, received intravenous Lasix and had significant improvement of pulmonary edema. She has had significant improvement of oxygenation and the patient was ambulating well. She did have back pain and had an MRI for which there were no new acute changes and an MRI consistent with need in further intervention. The patient was ambulating on her current medical regimen from home with additional Lasix for which she was discharged in good condition.   DISCHARGE MEDICATIONS: Bisoprolol 5 mg each day, folic acid 1 mg each day, furosemide 40 mg twice per day, guaifenesin 600 mg twice per day, levothyroxine 0.125 mg each day, Remeron 15 mg each evening, Protonix 40 mg each day, potassium 20 mEq each day, Pravastatin 40 mg each evening, warfarin 2 mg each day.   FOLLOWUP: She is to follow up in 2 days for further evaluation of PT, INR with a goal PT, INR between 2 to 3, and Dr. Philemon KingdomKowalski's office on Thursday for further adjustments of medication management.  ____________________________ Lamar BlinksBruce J. Kowalski, MD bjk:cb D: 08/18/2012 08:07:49 ET T: 08/18/2012 10:46:28 ET JOB#: [id number]  cc: Lamar BlinksBruce J. Kowalski, MD, <Dictator> Lamar BlinksBRUCE J KOWALSKI MD ELECTRONICALLY SIGNED 09/03/2012 8:25

## 2014-12-09 NOTE — Consult Note (Signed)
   Present Illness 81 yo female with history of aortic vavle replacement in 1989 in [place] with a St. Jude mechanical ldevice. SHe has been chronically anticoagulated with warfarin and admission inr was therapeutic at 2.7. She is admitted with  a mechanical fall resulting in a hip fracture. and left elbow injury. She is hemodynamically stable. She is unaware of any cad but does not recall having any revalsculariztion in addition to her valve in 1989. She denies chest pain or shortness of breath.   Physical Exam:  GEN thin   NECK supple   RESP normal resp effort  clear BS  no use of accessory muscles   CARD Regular rate and rhythm  Murmur  metallic heart sounds   Murmur Systolic   Systolic Murmur Out flow   ABD denies tenderness  normal BS  no Abdominal Bruits   LYMPH negative neck, negative axillae   EXTR negative cyanosis/clubbing, negative edema   NEURO cranial nerves intact, motor/sensory function intact   PSYCH A+O to time, place, person   Review of Systems:  Subjective/Chief Complaint hip and elbow pain   ENT: No Complaints   Eyes: No Complaints   Neck: No Complaints   Respiratory: No Complaints   Cardiovascular: No Complaints   Gastrointestinal: No Complaints   Genitourinary: No Complaints   Vascular: No Complaints   Musculoskeletal: Muscle or joint pain   Neurologic: No Complaints   Hematologic: No Complaints   Endocrine: No Complaints   Psychiatric: No Complaints   Review of Systems: All other systems were reviewed and found to be negative   Medications/Allergies Reviewed Medications/Allergies reviewed   EKG:  Interpretation probable sinus tachycardia; no ischemia    No Known Allergies:    Impression 81 yo female with history of aortic vavle replacment with a mechanicaly aortic device, who was asdmitted with a left humoral and femoral fracutre after a mechanical fall. Her inr was 2.7 on admission. Warfarin was discontinued. Recieved 10 mg of  vitamin K in the er. Would allow the inr to fall without further vit k. WIll bridge with lovenox 1 mg/kg starting now adn holding 12 hours prior to surgery. WOuld resume coumadin asap post surgery and continue lovenox bridge until therapeutic on her warfarin. Pt is at moderate risk for surgery given age and comorbid condition.   Plan 1. Hold warfarin 2. Bridge with lovenox 1 mg/kg sq bid 3. Stop lovenox 6 hours prior to surgery 4. Resume warfarin asap after surgery 5. Continue lovenox 1 mg/kg bid until inr is therapeutic post op. 6. Proceed with surgery with routine cardiac monitoring 7. Continue with bisoprolol at current dose   Electronic Signatures: Dalia HeadingFath, Taym Twist A (MD)  (Signed 30-Jun-14 13:50)  Authored: General Aspect/Present Illness, History and Physical Exam, Review of System, Home Medications, EKG , Allergies, Impression/Plan   Last Updated: 30-Jun-14 13:50 by Dalia HeadingFath, Tivon Lemoine A (MD)

## 2014-12-09 NOTE — Consult Note (Signed)
PATIENT NAME:  Christy Webster, Christy Webster MR#:  [id number] DATE OF BIRTH:  18-Sep-1928  DATE OF CONSULTATION:  02/15/2013  REFERRING PHYSICIAN:  Dr. Darnelle Catalan.  CONSULTING PHYSICIAN:  Ramonita Lab, MD  ATTENDING PHYSICIAN: Dr. Bethann Punches.   REASON FOR THE CONSULT: Medical management/preop clearance.   HISTORY OF PRESENT ILLNESS: The patient is an 81 year old, pleasant, Caucasian female with a past medical history of hypertension, hyperlipidemia, congestive heart failure and aortic valve replacement. Was sent over to the ER from nursing home after she sustained a fall. In the ER, the patient was admitted to orthopedics, Dr. Rosita Kea, regarding left femoral neck fracture and left supracondylar fracture. Medical consult is placed to hospitalist group, and cardiology consult is placed to Dr. Gwen Pounds regarding preop clearance. The patient has history of congestive heart failure as well as aortic valve replacement with St. Jude's valve, and she is on Coumadin with an INR of 2.7. Right now, the patient denies any chest pain or shortness of breath. Complaining of 7 out of 10 pain in her left hip area and left elbow area. She denies any other complaints. The patient is coming from nursing home and she has her yellow DNR. The patient is asking [place] to hold off on the DNR tonight and she wants to be FULL CODE until she gets her surgery done. This was discussed with patient's son, Mr. Weslyn Holsonback, over the phone who is her medical power of attorney and is also agreeable with the patient's decision. This was clarified as the patient has some Alzheimer's dementia. Otherwise, the patient denies any complaints.   PAST MEDICAL HISTORY: Congestive heart failure diastolic with an ejection fraction of 70%, aortic valve replacement with St. Jude's valve in 1989. The patient is on Coumadin. Hypothyroidism, hyperlipidemia, osteoporosis, hypertension, arthritis, diabetes mellitus.   PAST SURGICAL HISTORY: Right hip replacement in the year  2006, aortic valve replacement.   ALLERGIES: She has no known drug allergies.   PSYCHOSOCIAL HISTORY: Residing in assisted living facility. No history of smoking, alcohol or illicit drug usage.   FAMILY HISTORY: Coronary artery disease runs in her family.   HOME MEDICATIONS: Coumadin 2 mg once daily, vitamin E 200 international units 2 capsules once daily, vitamin D3 one capsule once daily, vitamin C 500 mg once daily, Tylenol Arthritis 1 capsule every 8 hours,  potassium chloride 2 tablets once daily, mirtazapine 50 mg once daily, Lutein 10 mg 1 capsule once daily, levothyroxine 100 mcg once daily, furosemide 20 mg once daily, folic acid 1 mg once daily, fentanyl patch 25 mcg per hour transdermally to change every 72 hours, duloxetine 30 mg 2 times a day, Colace 60 mg/15 mL (she takes 10 mL once daily), bisoprolol 5 mg 1 tablet once daily.   REVIEW OF SYSTEMS:  CONSTITUTIONAL: Denies any fever, chills. Denies any fatigue.  EYES: Denies any blurry vision, double vision. No eye redness.  ENT: Denies epistaxis, discharge, snoring.  RESPIRATION: Denies cough, COPD, hemoptysis.   CARDIOVASCULAR: No chest pain, palpitations. Has aortic valve replacement, on Coumadin.  GASTROINTESTINAL: Denies nausea, vomiting, diarrhea, GERD.  GENITOURINARY: No dysuria or hematuria.  GYNECOLOGY AND BREASTS: Denies breast mass or vaginal discharge.  ENDOCRINE: Denies any polyuria, nocturia, thyroid problems.  HEMATOLOGIC AND LYMPHATIC: No anemia, easy bruising or bleeding.  INTEGUMENTARY: No acne, rash, lesions.  MUSCULOSKELETAL: Complaining of pain in the left elbow and left femoral neck. Denies any knee pain. Denies gout.  NEUROLOGIC: No history of ataxia. Has Alzheimer's dementia. Denies vertigo.  PSYCHIATRIC: Denies any  ADD, OCD, bipolar disorder.   PHYSICAL EXAMINATION:  VITAL SIGNS: Temperature 97.8, pulse 94, respirations 18, blood pressure 143/66, pulse ox 100% percent.  GENERAL APPEARANCE: Not in any  acute distress but uncomfortable from left femoral neck pain and left elbow pain.  HEENT: Normocephalic. Pupils are equal, reacting to light and accommodation. No scleral icterus. No conjunctival injection. No sinus tenderness. No postnasal drip.  NECK: Supple. No JVD. No thyromegaly.  LUNGS: Clear to auscultation bilaterally. No accessory muscle usage. No anterior chest wall tenderness on palpation.  CARDIAC: S1, S2 normal. Regular rate and rhythm. Positive murmur. No edema.  GASTROINTESTINAL: Soft. Bowel sounds are positive in all 4 quadrants. Nontender, nondistended. No hepatosplenomegaly. No masses felt.  NEUROLOGIC: Awake, alert, oriented x3. Cranial nerves II through XII were intact. Motor and sensory are grossly intact.  EXTREMITIES: The left hip is externally rotated. Left elbow is abducted. Left femoral area and left elbow area are tender. Right upper extremity and right lower extremity are intact. No edema. No cyanosis. No clubbing.  SKIN: Warm to touch. Normal turgor. No rashes. No. No lesions.  MUSCULOSKELETAL: No joint effusion but tender in the left hip and left ankle area. The rest of the joints are intact.  PSYCHIATRIC: Flat mood and affect.   LABS AND IMAGING STUDIES: WBC 11.0, hemoglobin 11.1, hematocrit 33.8, platelets 159. PT 28.1. INR 2.7. Glucose 132, sodium 140, potassium 3.8, chloride 103, CO2 31. GFR greater than 60. Anion gap 6. Calcium is 8.7. Albumin 3.4. A 12-lead EKG has revealed sinus tachycardia at 111 beats per minute. Normal PR interval. Normal QRS duration. Right bundle branch block and left anterior fascicular block. Nonspecific T wave changes to consider lateral ischemia.   ASSESSMENT AND PLAN:  1. An 81 year old female admitted to ortho service to Dr. Rosita KeaMenz after she sustained a fall and had left femoral neck fracture and left supracondylar fracture. Hospitalist is consulted regarding medical management and preop clearance. Dr. Rosita KeaMenz has consulted cardiology, Dr.  Gwen PoundsKowalski, in a.m. regarding preop clearance. The patient is medically cleared/optimized except from cardiology standpoint which is pending at this time.  2. Hypertension: For now continue her home medication.  3. Hyperlipidemia: Stable. Continue home medication.  4. Hypothyroidism. Resume levothyroxine.  5. History of diastolic congestive heart failure. Stable and not fluid overloaded. Previous ejection fraction is 70%. The patient will be evaluated by cardiology in a.m.  6. History of aortic valve replacement: The patient takes Coumadin on daily basis. Last dose was taken on June 29th at 6 p.m. INR is at 2.7. Will give her 1 dose of vitamin K. Plan is to consider giving her fresh frozen plasma prior to surgery after cardiology clearance in the a.m. Blood transfusion consent is signed for possible fresh frozen plasma transfusion. Will check PT/INR in a.m.  7. Chronic history of osteoporosis: Will continue vitamin D and for pain management the patient has fentanyl patch. Will continue that for now.   The patient has a DNR order but she prefers to be FULL CODE for now. This was discussed with the patient's son, Mr. Carver LionsJames Larivee, who is her medical power of attorney as the patient has Alzheimer's dementia, and he is agreeable for FULL CODE status for now. This will be further discussed with the patient and her son in a.m. if it needs more clarification. [facility]   Thank you, Dr. Rosita KeaMenz, for allowing the hospitalist group to medically take care of this patient.  TOTAL TIME SPENT ON THIS CONSULT: 50 minutes.  ____________________________ Ramonita Lab, MD ag:gb D: 02/15/2013 02:02:24 ET T: 02/15/2013 05:20:53 ET JOB#: [id number]  cc: Ramonita Lab, MD, <Dictator> Ramonita Lab MD ELECTRONICALLY SIGNED 02/26/2013 6:17

## 2014-12-09 NOTE — Op Note (Signed)
PATIENT NAME:  Christy Webster, Christy Webster MR#:  [id number] DATE OF BIRTH:  August 05, 1929  DATE OF PROCEDURE:  02/17/2013  PREOPERATIVE DIAGNOSIS:  Left femoral neck fracture and osteoarthritis, left supracondylar comminuted humerus fracture.  POSTOPERATIVE DIAGNOSIS:  Left femoral neck fracture and osteoarthritis, left supracondylar comminuted humerus fracture.   PROCEDURE:  Left total hip replacement, anterior approach and ORIF left distal humerus.   ANESTHESIA:  General.   SURGEON:  Leitha Schuller, M.D.   DESCRIPTION OF PROCEDURE:  The patient was brought to the Operating Room and after adequate anesthesia was obtained, the patient was transferred to the operative table with the left leg in a Medacta attachment, right leg in a low legholder.  Traction was applied and preop x-rays taken to show alignment.  With the picture being printed, the hip was then prepped and draped in the usual sterile fashion with appropriate patient identification and timeout procedures then being completed.  An anterior approach was made centered over the greater trochanter, incision down through the skin and subcutaneous tissue was performed.  The fascia over the TFL was then incised and the TFL retracted laterally, the rectus retracted medially.  The circumflex vessels were ligated.  The anterior capsule was then exposed and the flap made of the anterior capsule with a modified Charnley retractor placed.  Next, the head was removed and the neck was revised with the use of a rongeur.  There was a mild degenerative change present on the head.  The labrum was excised and sequential reaming was carried out.  A 48 mm trial gave a good fit with bleeding bone in the periphery and a 50 mm Versafit cup DM was impacted into place.  The leg was externally rotated and partial release of the posterior capsule was carried out to mobilize the femur.  The leg was then dropped in extension with abduction.  Sequential broaching carried out to a size 4  with a 4 trial.  Leg length appeared to be restored with a short neck.  The final component was then impacted, head assembled and the hip reduced with good component length apparent on x-ray.  The wound was then thoroughly irrigated.  The capsule repaired using a 0 Ethibond heavy quill for the tensor fascia, 2-0 quill subcutaneously with a subcutaneous drain and skin staples.  Xeroform, 4 x 4's, ABD and tape applied.  The leg was taken out of the Medacta attachment and the right leg out of the low legholder and placed on a padded table.  The arm was then prepped and draped with a sterile tourniquet being utilized.  After patient identification, timeout procedures were completed, a posterior approach was made with triceps on approach going medial and lateral, first dissecting out the ulnar nerve and performing a subcutaneous transposition of the ulnar nerve.  The fracture was exposed and the triceps elevated off the distal humerus medial and laterally to provide adequate exposure of the distal humerus.  The medial side was reduced first with a K wire holding it, followed by application of a plate with provisional fixation using K wires.  This gave anatomic alignment of the medial side.  Going lateral the fracture was reduced and plate applied.  Provisional fixation again with K wires and then C-arm was brought in and good alignment with the plates was obtained.  Locking screws were placed after first using nonlocking screws to get the plates down to the bone.  Sequentially filling them, there did not appear to be any penetration into  the joint.  After all screws were filled the elbow was examined under fluoroscopy with good stability and varus and valgus stress.  The wound was thoroughly irrigated and then closed with 2-0 Vicryl subcutaneously and skin staples.  Xeroform, 4 x 4's, ABD, Webril and a posterior splint were applied.  The patient was then sent to the recovery room in stable condition.   ESTIMATED BLOOD  LOSS:  850 mL.   COMPLICATIONS:  None.   SPECIMENS:  Femoral head.   TOURNIQUET TIME:  On the left arm was 103 minutes at 250 mmHg.   IMPLANTS:  For the hip, Medacta femoral head size S 28 mm, Medacta DM liner for the 48 mm Versafit cup DM and a 4 standard AMIS stem.  For the elbow, a large left medial distal humeral plate and a small left posterior lateral distal plate from [employer] humeral locking system with multiple screws.   CONDITION:  To recovery room, stable.     ____________________________ Leitha SchullerMichael J. Lathen Seal, MD mjm:ea D: 02/17/2013 21:41:45 ET T: 02/18/2013 03:37:12 ET JOB#: [id number]  cc: Leitha SchullerMichael J. Baylee Mccorkel, MD, <Dictator> Leitha SchullerMICHAEL J Lyndsy Gilberto MD ELECTRONICALLY SIGNED 02/18/2013 6:03

## 2014-12-09 NOTE — Consult Note (Signed)
PATIENT NAME:  Christy Webster, Christy Webster MR#:  [id number] DATE OF BIRTH:  04-01-1929  DATE OF CONSULTATION:  12/29Lesly Rubenstein/2013  REFERRING PHYSICIAN:  Dr. Judithann SheenSparks. CONSULTING PHYSICIAN:  Lamar BlinksBruce J. Lakeita Panther, MD  PRIMARY CARE PHYSICIAN:  Bethann PunchesMark Miller, Webster.D.   REASON FOR CONSULTATION: Acute on chronic congestive heart failure.   CHIEF COMPLAINT: "I'Webster short of breath."   HISTORY OF PRESENT ILLNESS: This is an 81 year old female with known valvular heart disease, status post mitral valve replacement, and pulmonary hypertension with acute on chronic congestive heart failure. The patient has had known chronic atrial fibrillation, on appropriate medication management as well as anticoagulation, for which she has not had any apparent bleeding complications. She has hypertension as well and well controlled. The patient currently has had significant weight gain, shortness of breath and pulmonary edema. She was given some intravenous Lasix with improvement of symptoms and is now oxygenating well with supplemental oxygen.   REVIEW OF SYSTEMS: The remainder review of systems negative for vision change, ringing in the ears, hearing loss, cough, congestion, heartburn, nausea, vomiting, diarrhea, bloody stools, stomach pain, extremity pain, leg weakness, cramping of the buttocks, known blood clots, headaches, blackouts, dizzy spells, nosebleed, congestion, trouble swallowing, frequent urination, urination at night, muscle weakness, numbness, anxiety, depression, skin lesions or skin rashes.   PAST MEDICAL HISTORY: 1.  Mitral valve disease, status post replacement,  2.  Hypertension.  3.  Hyperlipidemia.  4.  Chronic atrial fibrillation.   FAMILY HISTORY: No family members with early onset of cardiovascular disease.   SOCIAL HISTORY: Currently denies alcohol or tobacco use.   ALLERGIES: No known drug allergies.   CURRENT MEDICATIONS: As listed.   PHYSICAL EXAMINATION: VITAL SIGNS: Blood pressure 120/68 bilaterally, heart  rate 70 upright reclining, and slightly irregular.  GENERAL: She is a well appearing female in no acute distress.  HEENT: No icterus, thyromegaly, ulcers, hemorrhage or xanthelasma.  CARDIOVASCULAR: Irregularly irregular with normal crisp S1 with click consistent with mitral valve replacement. Normal S2 with a 2 out of 6 apical murmur consistent with mitral regurgitation. PMI is inferiorly displaced. Carotid upstroke normal without bruit. Jugular venous pressure is slightly elevated.  LUNGS: Have bibasilar crackles with normal respirations.  ABDOMEN: Soft, nontender, without hepatosplenomegaly or masses. Abdominal aorta is normal size without bruit.  EXTREMITIES: Show 2+ radial, femoral, and trace dorsal pedal pulses with 1+ lower extremity edema. No cyanosis, clubbing or ulcers.  NEUROLOGIC: She is oriented to time, place and person with normal mood and affect.   ASSESSMENT: An 81 year old female with mitral valve replacement, chronic atrial fibrillation, hypertension, hyperlipidemia with acute on chronic congestive heart failure, needing further treatment options.   RECOMMENDATIONS: 1.  Continue intravenous Lasix for 24 more hours and oral Lasix thereafter.  2.  Continue anticoagulation for stroke risk and thrombosis risk of mitral valve with a goal INR between 2.5 and 3.5.  3.  Further consideration of echocardiogram for left ventricular systolic dysfunction, valvular heart disease as changes have occurred and further adjustments of medications.  4.  Use of beta blocker as necessary if patient needs heart rate control after ambulation.  5.  Watch closely for chronic kidney disease with intravenous Lasix.  6.  Hypertension control with ACE inhibitor if able.      ____________________________ Lamar BlinksBruce J. Lakima Dona, MD bjk:cs D: 08/16/2012 08:50:00 ET T: 08/16/2012 19:59:28 ET JOB#: [id number]  cc: Lamar BlinksBruce J. Vivian Okelley, MD, <Dictator> Lamar BlinksBRUCE J Panda Crossin MD ELECTRONICALLY SIGNED 09/03/2012 8:24

## 2014-12-09 NOTE — Discharge Summary (Signed)
PATIENT NAME:  Christy Webster, Christy Webster MR#:  [id number] DATE OF BIRTH:  15-Oct-1928  DATE OF ADMISSION:  03/22/2013 DATE OF DISCHARGE:  03/23/2013   PRIMARY CARE PHYSICIAN: Danella PentonMark F. Miller, MD  CONSULTING PHYSICIAN: Leitha SchullerMichael J. Menz, MD  DISCHARGE DIAGNOSES:  1. Abdominal pain with fecal impaction and severe constipation.  2. Pubic rami fractures.   CONDITION: Satisfactory.   CODE STATUS: Full code.   MEDICATIONS: [facility].   DIET: Low-sodium diet.   ACTIVITY: As tolerated.   FOLLOWUP CARE: Follow up with PCP within 1 to 2 weeks.   REASON FOR ADMISSION: Constipation, abdominal pain and vomiting.   HOSPITAL COURSE: The patient is an 81 year old Caucasian female with a history of left hip fracture, aortic valve stenosis, on Coumadin, hypertension, who was sent from nursing home due to abdominal pain, vomiting and constipation. In ED, CAT scan of abdomen showed severe constipation with fecal impaction. The patient was treated with soap enema and Colace, and   constipation resolved, but developed multiple times diarrhea. The patient's diarrhea resolved today. The patient has no more abdominal pain, constipation or diarrhea. She is clinically stable and will be discharged back to nursing home today. I discussed the patient's discharge plan with the patient, case manager and nurse.   TIME SPENT: About 33 minutes.   ____________________________ [name] Venesa Semidey, MD qc:OSi D: 03/23/2013 09:53:26 ET T: 03/23/2013 10:02:08 ET JOB#: [id number]  cc: [name] [name], MD, <Dictator> [name] [name] MD ELECTRONICALLY SIGNED 03/23/2013 13:48

## 2014-12-09 NOTE — Discharge Summary (Signed)
PATIENT NAME:  Christy Webster, Christy Webster MR#:  [id number] DATE OF BIRTH:  Aug 04, 1929  DATE OF ADMISSION:  02/15/2013 DATE OF DISCHARGE:  02/23/2013   ADMITTING DIAGNOSES: Left femoral neck fracture and osteoarthritis, left supracondylar comminuted humerus fracture.   DISCHARGE DIAGNOSES: Left femoral neck fracture and osteoarthritis, left supracondylar comminuted humerus fracture.   PROCEDURE: Left total hip replacement, anterior approach, and open reduction and internal fixation of left distal humerus.   ANESTHESIA: General.   SURGEON: Leitha Schuller, MD   ESTIMATED BLOOD LOSS: 850 mL.   COMPLICATIONS: None.   SPECIMENS: Femoral head.   TOURNIQUET TIME: On the left arm, was 103 minutes at 250 mmHg.   IMPLANTS: For the hip, Medacta femoral head size S 28 mm, Medacta DM liner for the 48 mm Versafit cup DM and a 4 standard AMIS stem. For the elbow, a large left medial distal humeral plate and a small left posterior lateral distal plate with Biomet humeral locking system with multiple screws.   CONDITION: To recovery room stable.   HISTORY: The patient is an 81 year old female who presented to the Emergency Room after a fall. The patient lives at [facility] and is able to ambulate with a walker. The patient's sustained an injury to the left hip and the left arm and elbow. The patient was seen by consultation in the Emergency Room, and x-rays revealed a left femoral neck fracture and a left supracondylar elbow fracture. The patient was admitted for surgical evaluation with Dr. Rosita Kea. The patient also needed Dr. Gwen Pounds to do a cardiology consult.   PHYSICAL EXAMINATION:  GENERAL: An elderly female in no acute distress. She was in Buck's traction with limited motion of the left side. She has normal communication.  HEENT: Pupils are equally reactive. There is no nasal congestion or drainage and no erythema.  LUNGS: Clear to auscultation bilaterally with no tenderness.  CARDIAC: She has an irregular  rate and rhythm with a positive murmur in the systolic region.  MUSCULOSKELETAL: In regard to the left lower extremity, the patient has external rotation with slight shortening. The patient has pain to any type of ambulation or palpation. The patient is in Buck's traction which is held comfortably. The patient is neurovascularly intact in the left lower extremity. The left upper extremity is also held in a fixed position, with the pain with any type of manipulation. The patient is tender at the distal humerus to palpation. There is some diffuse edema with mild ecchymosis throughout.  SKIN: Warm to touch throughout the upper and lower extremities.   HOSPITAL COURSE: The patient was admitted to the hospital on 02/15/2013 for a left femoral neck fracture and a left supracondylar elbow fracture. She was seen by medicine for preoperative clearance. Cardiology was consulted for aortic valve replacement as the patient was on Coumadin. Cardiology decided to hold the Coumadin and bridge with Lovenox. The patient's INR was elevated, so vitamin K was given. On 02/16/2013, INR was elevated at 2.3, so surgery was canceled. She was given 2 units of fresh frozen plasma. On 02/17/2013, the patient was given 2 units of fresh frozen plasma, and the patient was able to undergo surgery for left total hip replacement and left supracondylar ORIF. One unit of packed red blood cells was given in the OR for a significant amount of blood loss. On 02/18/2013, the patient had acute postoperative blood loss anemia with a hemoglobin of 7.7. She was transfused 1 unit of packed red blood cells. Lovenox was held.  Palliative care consulted the patient, and she decided to be a do not resuscitate patient. On 02/19/2013, postoperative day 2, the patient did have some dyspnea. Medicine ordered chest x-ray, which showed chronic pleural effusion, and she was started on Lasix. Her Lovenox was discontinued due to active bleeding, and heparin drip was  started along with the patient's Coumadin. On 02/20/2013, postoperative day 3, hemoglobin was up to 9.5. Her white blood cells were up, and this was thought to be partially due to a UA, for which she was being treated with Rocephin as well as thought to be due to stress-related. On 02/21/2013, postoperative day 4, hemoglobin was down to 8.0. She continued to have drainage from the incision site. White blood cells were down, but still slightly elevated. On 02/22/2013, heparin was discontinued due to continued drainage from the incision. INR was checked daily, with a goal of reaching 2.5 for her aortic valve replacement. On postoperative day 5, the patient's hemoglobin had trended down to 7.9. On 02/23/2013, postoperative day 6, a hemoglobin was up to 8.6, and the patient's INR was still low at 1.4. The patient was doing well. She had slow progress with therapy. She was stable and ready for discharge to rehab facility. The patient will need to continue with daily INRs until therapeutic on Coumadin to 2.5, and she will also need a recheck of her T4 in 4 weeks to make sure that she is properly medicated with Synthroid.   DISCHARGE INSTRUCTIONS:  1. The patient may gradually increase weight-bearing on the affected extremity. She is nonweightbearing on the left upper extremity.  2. She may resume a regular diet as tolerated.  3. Apply an ice pack to the affected area.  4. Do not get the dressing or bandage wet or dirty. Call [facility] if the dressing gets water under it. Leave the dressing on. Leave provena on left elbow until 2-week postoperative appointment with [facility].  5. Call [facility] if any of the following occur: Bright red bleeding from the incision or wound, fever above 101.5 degrees, redness, swelling or drainage at the incision. Call [facility] if you experience any increased leg pain, numbness or weakness in your legs or bowel or bladder symptoms.  6. Physical therapy will be  undergone at the rehab facility as well as occupational therapy.  7. INR needs to be checked daily until therapeutic at 2.5, and T4 levels need to be checked in 4 weeks and adjust Synthroid as needed.  8. She has a followup appointment in 2 weeks with [facility].   DISCHARGE MEDICATIONS:  1. Folic acid 1 mg oral tablet 1 tablet orally once a day.  2. Bisoprolol 1 tablet orally once a day 5 mg. 3. Pravastatin 40 mg oral tablet 1 tablet orally once a day at bedtime.  4. Furosemide 20 mg 1 tablet orally once a day in the evening.  5. Mirtazapine 15 mg 1 tablet orally once a day at bedtime.  6. Potassium chloride extended release 20 mEq 2 tabs orally once a day. 7. Lutein 10 mg oral capsule 1 cap orally once a day.  8. Fentanyl 25 mcg an hour transdermal film extended-release 1 patch transdermally every 72 hours. Remove old patch before applying new patch.  9. Vitamin C 500 mg oral tablet 1 tablet orally once a day.  10. Vitamin E 200 international units oral capsule 2 caps orally once a day.  11. Vitamin D3 2000 international units oral capsule 1 cap orally  once a day. 12. Duloxetine 30 mL oral delayed-release capsule 1 cap orally 2 times a day. 13. Colace sodium 60 mg/15 mL oral syrup 10 mL orally once a day as needed.  14. Oxycodone 5 mg oral tablet 1 tablet orally every 4 hours as needed for pain.  15. Tramadol 50 mg oral tablet 1 tablet orally every 4 to 6 hours as needed for pain.  16. Warfarin 1 mg oral tablet 6 tabs orally once a day at bedtime.  17. Levothyroxine 75 mcg 1 tablet orally once a day. Recheck T4 in four weeks.  18. Tylenol 500 mg oral tablet 1 tablet orally every 4 hours as needed for pain or temperature greater than 100.4.  19. Magnesium hydroxide 8% oral suspension 30 mL orally 2 times a day as needed for constipation.  20. Aluminum hydroxide/magnesium hydroxide/simethicone 400 mg/400 mg/40 mg per 5 mL oral suspension 30 mL orally every 6 hours as needed for  indigestion or heartburn.  21. Warfarin 1 mg oral tablet 3 tabs orally every 11:00 a.m. 22. Fluticasone/salmeterol 20 mcg/50 mcg inhalation powder 1 puff inhaled 2 times a day. 23. Ensure 240 mL orally once a day. 24. Ensure Plus 237 mL orally 2 times a day.   ____________________________ T. Cranston Neighborhris Keen Ewalt, PA-C tcg:OSi D: 02/23/2013 13:02:26 ET T: 02/23/2013 13:42:03 ET JOB#: [id number]  cc: T. Cranston Neighborhris Keasha Malkiewicz, PA-C, <Dictator> Evon SlackHOMAS C Nehemiah Montee GeorgiaPA ELECTRONICALLY SIGNED 02/24/2013 7:41

## 2014-12-09 NOTE — Discharge Summary (Signed)
PATIENT NAME:  Christy Webster, Christy Webster MR#:  [id number] DATE OF BIRTH:  1928-08-20  DATE OF ADMISSION:  08/16/2012 DATE OF DISCHARGE:  08/19/2012  ADDENDUM   This is the addendum to the discharge summary dictated by Dr. Gwen PoundsKowalski on December 31st.  Please note I saw the patient only on January 1st.   PATIENT'S MEDICATIONS AT DISCHARGE: 1. Folic acid 1 mg p.o. daily.  2. Bisoprolol 5 mg daily.  3. Pravastatin 40 mg at bedtime.  4. Potassium chloride oral powder 20 mEq p.o. daily.  5. Levothyroxine 125 mcg p.o. daily.  6. Vitamin D3 2000 international units p.o. daily.  7. Lasix 20 mg p.o. daily.  8. Remeron 15 mg at bedtime.  9. Warfarin 2 mg, 1 tablet at 5:00 p.Webster. The patient is supposed to get her PT/INR drawn on January 2nd, and if her INR is less than 3, need to resume her home dosing of Coumadin from January 2nd. Thereafter, PT and INR will be managed by the patient's primary care physician, Dr[facility] Ter[facility]Bronstein, [facility].  10. Tylenol 325 mg 2 tablets every 6 hours as needed.  11. Hydrochlorothiazide/triamterene 25/37.5 one-half tablet daily.  12. Advair 250/50 one puff b.i.d.  13. Guaifenesin 600 mg extended-release 1 tablet b.i.d.  14. Lidocaine 5% topical fluid apply daily on affected area and remove at bedtime.   DIET:  1. Low sodium, regular consistency.  2. Please resume 4 ounces of wine at night daily as before.   FOLLOWUP WITH: 1. Dr. Terance HartBronstein in 1 to 2 weeks.  2. Dr. Gwen PoundsKowalski in 1 to 2 weeks.  3. Check PT-INR January 2nd. Resume Coumadin as instructed above.   The rest of the discharge summary remains the same as per Dr. Gwen PoundsKowalski.   TIME SPENT: 30 minutes.     ____________________________ Wylie HailSona A. Allena KatzPatel, MD sap:OSi D: 08/19/2012 13:20:21 ET T: 08/19/2012 13:26:27 ET JOB#: [id number]  cc: Lanaya Bennis A. Allena KatzPatel, MD, <Dictator> Lamar BlinksBruce J. Kowalski, MD Teena Iraniavid Webster. Terance HartBronstein, MD Willow OraSONA A Dellar Traber MD ELECTRONICALLY SIGNED 08/23/2012 11:36

## 2014-12-09 NOTE — Consult Note (Signed)
Brief Consult Note: Diagnosis: pelvic fracture.   Patient was seen by consultant.   Orders entered.   Comments: pubic rami fractures were present when she fractured hip ok to be WBAT PT ordered entered.  Electronic Signatures: Leitha SchullerMenz, Ell Tiso J (MD)  (Signed 04-Aug-14 16:54)  Authored: Brief Consult Note   Last Updated: 04-Aug-14 16:54 by Leitha SchullerMenz, Tenicia Gural J (MD)

## 2014-12-09 NOTE — H&P (Signed)
PATIENT NAME:  Christy Webster, Christy Webster MR#:  [id number] DATE OF BIRTH:  Dec 09, 1928  DATE OF ADMISSION:  02/15/2013  ADMITTING DIAGNOSIS: Left hip pain and left elbow pain.  CHIEF COMPLAINT: Left elbow pain and left hip pain.  HISTORY OF PRESENT ILLNESS:  The patient is an 81 year old female who presented to the Emergency Room after a fall. The patient lives at [facility] and is able to ambulate with a walker. The patient sustained an injury to the left hip and the left arm and elbow. The patient was seen by consultation in the Emergency Room and x-rays revealed a left femoral neck fracture and a left supracondylar elbow fracture. The patient was admitted for surgical evaluation with Dr. Rosita KeaMenz. The patient also needed Dr. Gwen PoundsKowalski to do a cardiology consult.  PAST MEDICAL HISTORY:   1.  Congestive heart failure, diastolic, and ejection fraction of 70%. 2.  Aortic valve replacement with a [employer] in 1989. The patient is on Coumadin. 3.  Hypothyroidism. 4.  Hyperlipidemia. 5.  Osteoporosis. 6.  Hypertension. 7.  Arthritis. 8.  Diabetes mellitus.   PAST SURGICAL HISTORY:  1.  Right hip replacement in 2006. 2.  Aortic valve replacement in 1989.  ALLERGIES:  No known drug allergies.   DISCHARGE MEDICATIONS: 1.  Coumadin 2 mg once a day. 2.  Vitamin E 200 units 2 capsules daily. 3.  Vitamin D3 1 capsule daily. 4.  Vitamin C 500 mg daily. 5.  Tylenol Arthritis 1 capsule every 8 hours. 6.  Potassium chloride 2 tablets once daily. 7.  Mirtazapine 15 mg daily. 8.  Luetin 10 mg 1 capsule daily.  9.  Levothyroxine 100 mcg daily. 10.  Furosemide 20 mg daily. 11.  Folic acid 1 mg daily. 12.  Fentanyl patch 25 mcg per hour transdermally every 72 hours then change. 13.  Duloxetine 30 mg 2 tablets daily. 14.  Colace 60 mg per 15 mL daily. 15.  Bisoprolol 5 mg 1 tablet daily.   PHYSICAL EXAMINATION: GENERAL: An elderly female with no acute distress. She is in Buck's traction with limited  motion on the left side. She has normal communication though. HEENT:  Pupils are equally reactive.  There is no nasal congestion or drainage and no erythema.   LUNGS: Clear to auscultation bilaterally with no tenderness.  CARDIAC: She has an irregular rate and rhythm with a positive murmur in the systolic region.  MUSCULOSKELETAL: In regard to the left lower extremity, the patient has external rotation with slight shortening. The patient has pain to any type of ambulation or palpation. The patient is in Buck's traction, which is held comfortably. The patient has neurovascular intact of the lower extremity. The left upper extremity is also held in a flexed position with pain with any type of manipulation. The patient is tender at the distal humerus to palpation. There is some diffuse edema with mild ecchymosis throughout. The skin is warm to touch throughout the upper and lower extremities.   LABORATORY DATA:   White count is 11.0, hemoglobin of 11.1, INR 2.7, pro time 28.1 and platelets 159.  CLINICAL ASSESSMENT AND PLAN:   I discussed the treatment options with Dr. Rosita KeaMenz. Dr. Rosita KeaMenz spoke with the patient personally this morning. The patient had her left hip and left elbow signed and marked for surgery. We discussed doing a left total hip replacement and a left elbow open reduction and internal fixation.  The patient seemed to understand and agreed and consented to the surgery. The  patient, based on her INR levels, will not be n.p.o. today, but will be n.p.o. after midnight.   The patient will receive cardiology clearance today with Dr. Gwen Pounds. The patient will be followed by internal medicine as well. The patient has a DNR order that is FULL CODE. The patient will be followed in the hospital after surgery and will be followed up in the orthopedic clinic at [facility]. ____________________________ J. Dedra Skeens, [place] jtm:sb D: 02/15/2013 08:06:03 ET T: 02/15/2013 08:58:52 ET JOB#: [id number]  cc: J.  Dedra Skeens, [place], <Dictator> J Annelise Mccoy PA ELECTRONICALLY SIGNED 03/01/2013 9:00

## 2015-06-13 IMAGING — CR PELVIS - 1-2 VIEW
1 series · 1 of 1 positions shown · non-contrast
Comparison: none

REASON FOR EXAM: FALL PAIN
COMMENTS:

[t pelvis ap]
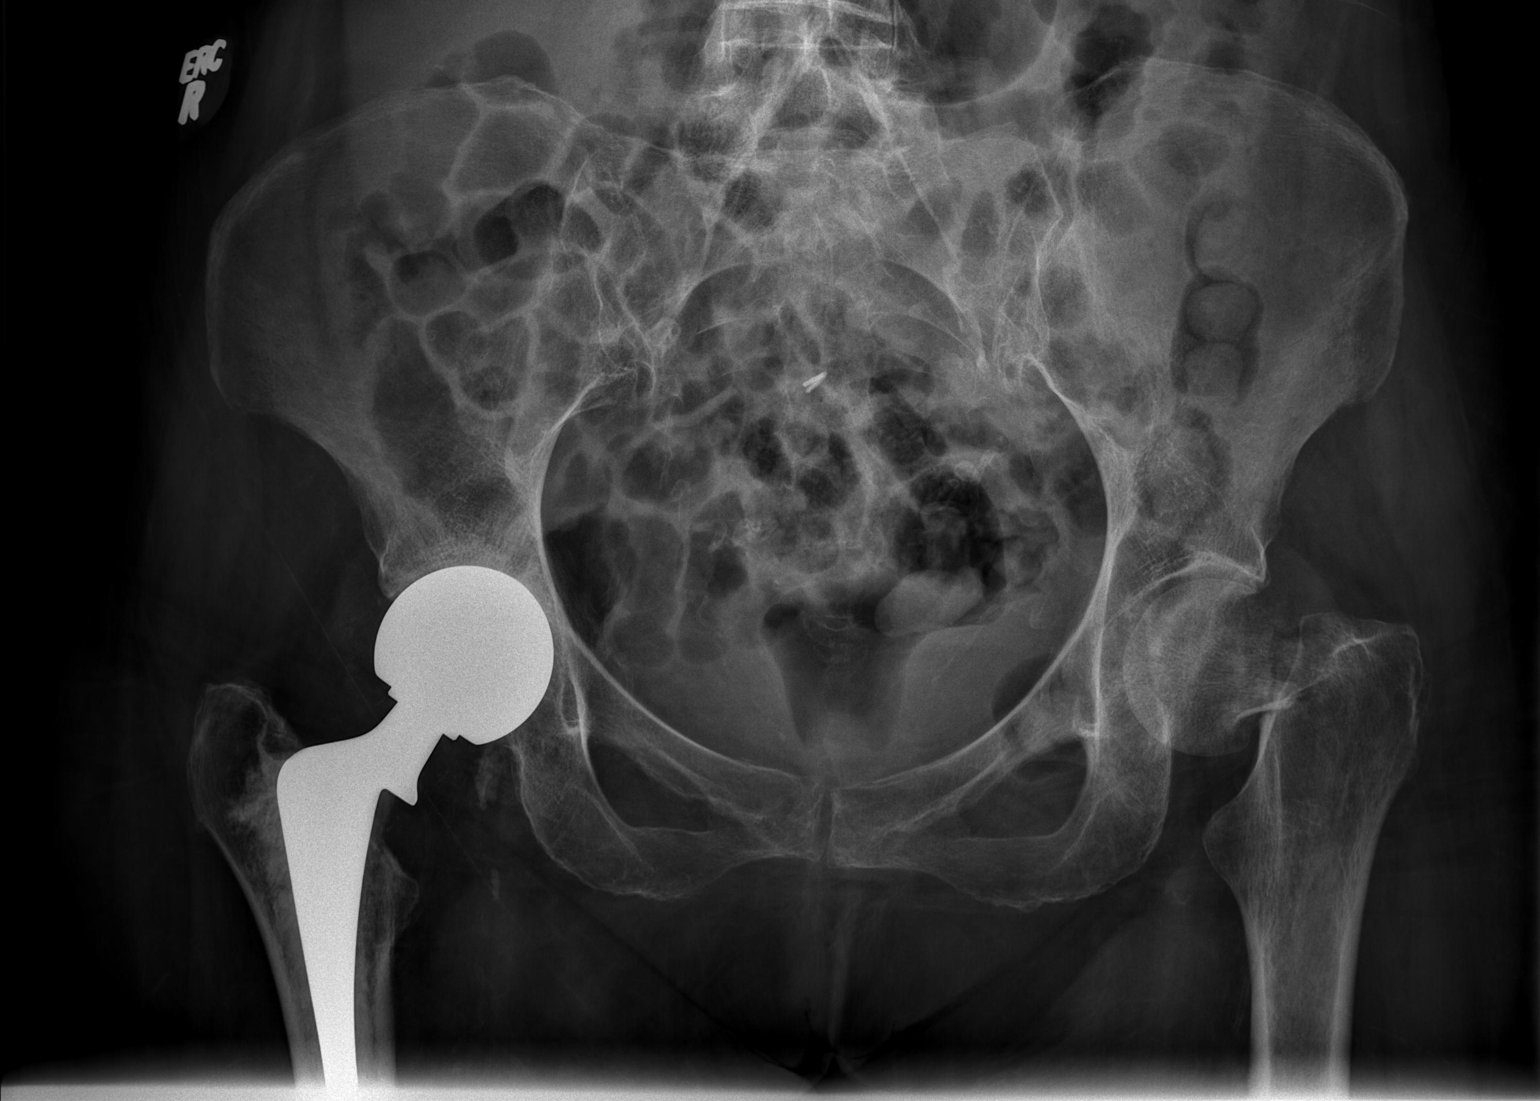

[1 of 1 positions shown; findings below may reference images not displayed]

PROCEDURE:     DXR - DXR PELVIS AP ONLY  - February 15, 2013 [DATE]

RESULT:     The bony pelvis is osteopenic. There is an acute subcapital
fracture of the left hip. There is a prosthetic right hip joint. No acute
fracture of the bony pelvis is demonstrated. The bowel gas pattern is within
the limits of normal.
IMPRESSION: The patient has sustained an acute subcapital fracture of
the left hip. There is no evidence of an acute fracture the bony pelvis.

[REDACTED]

## 2015-06-13 IMAGING — CR DG ELBOW COMPLETE 3+V*L*
1 series · 4 of 4 positions shown · non-contrast
Comparison: none

REASON FOR EXAM: FALL APIN
COMMENTS:

[Series 1: x elbow obl left · 0.14mm/px · 4 of 4 slices shown]
[im 1/4]
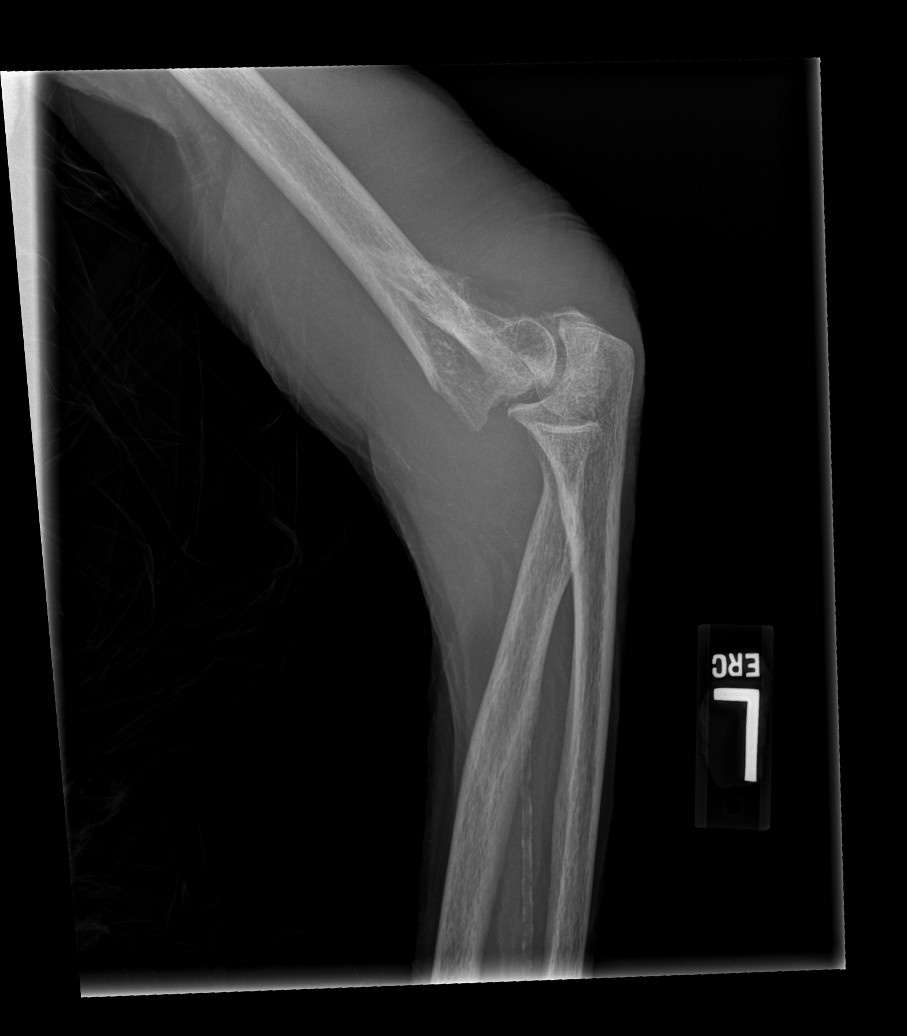
[im 2/4]
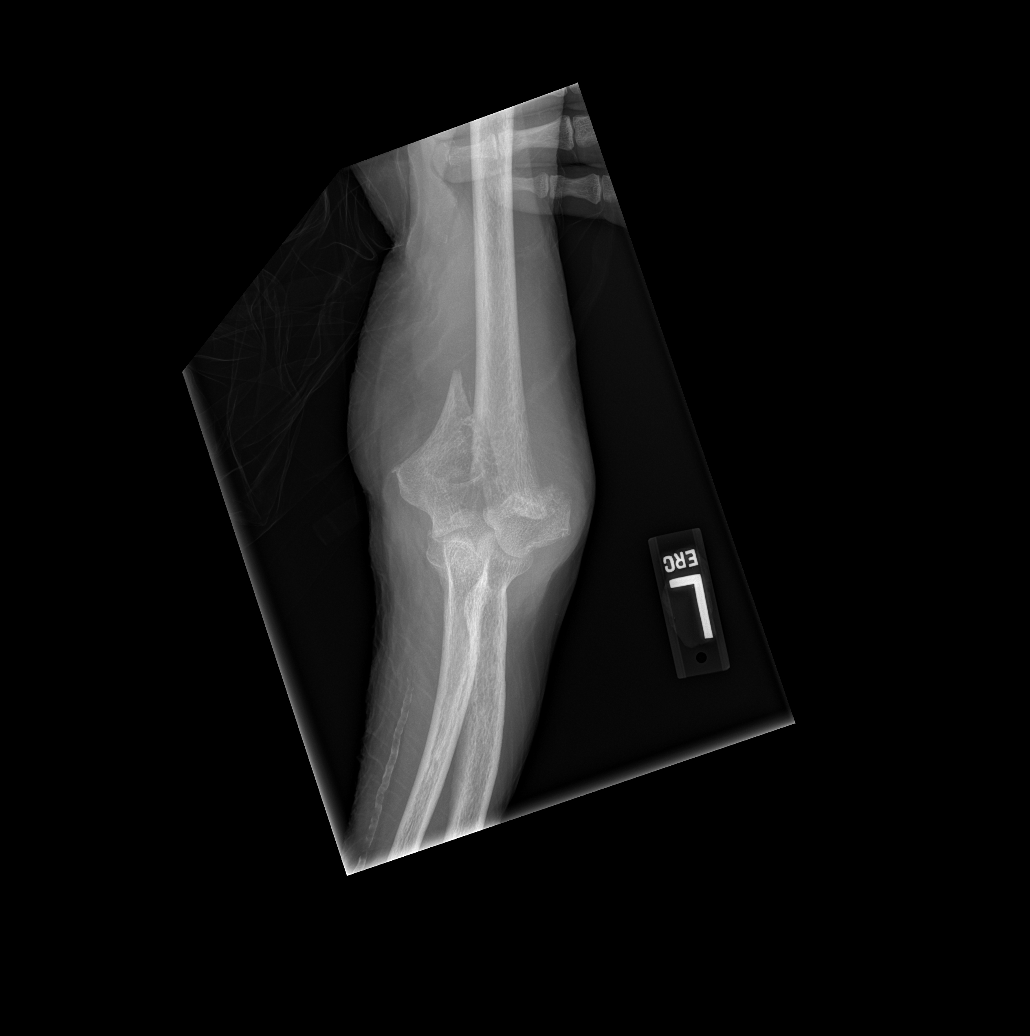
[im 3/4]
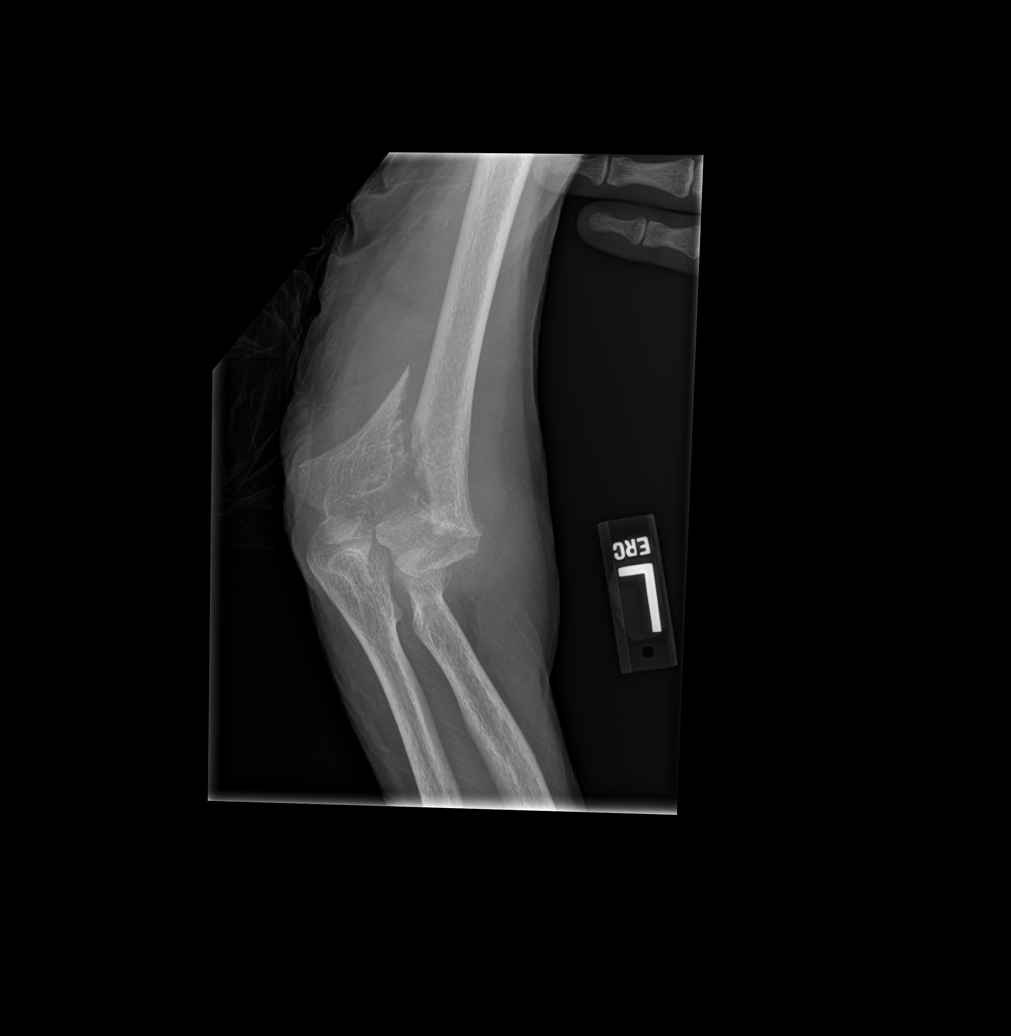
[im 4/4]
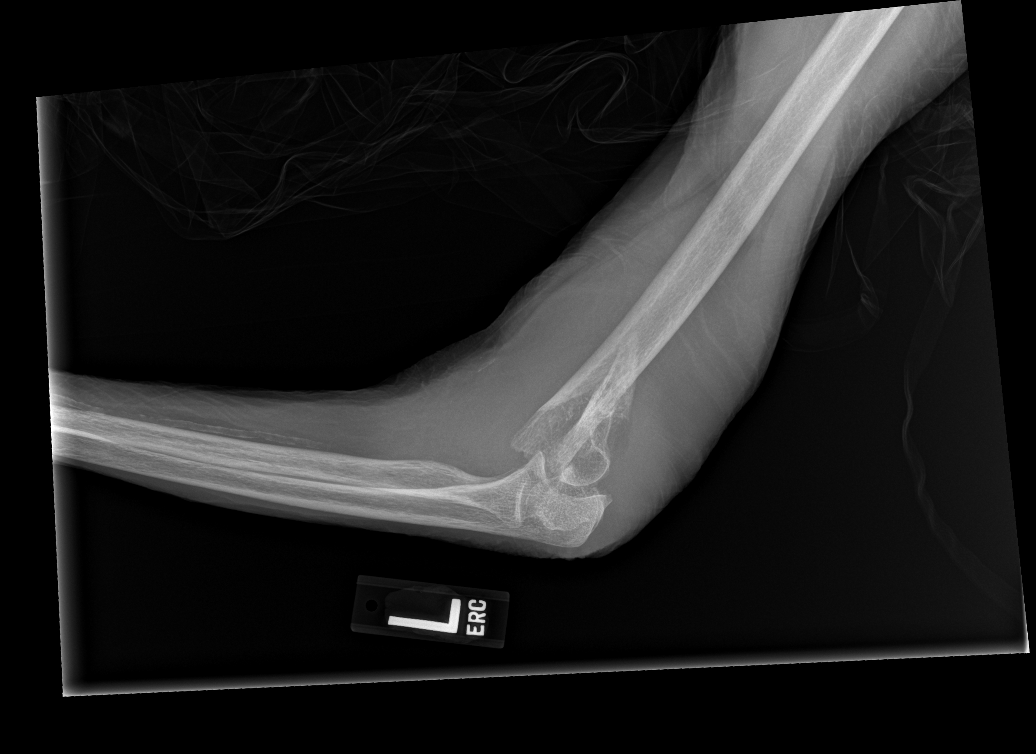

[4 of 4 positions shown; findings below may reference images not displayed]

PROCEDURE:     DXR - DXR ELBOW LT COMP W/OBLIQUES  - February 15, 2013 [DATE]

RESULT:     Four views of the left elbow reveal the patient to have
sustained an oblique fracture through the distal left humerus extending to
the articular surface. There is distraction of the fracture fragments. As
best as can be determined the olecranon and humeral head are intact. There
is soft tissue swelling diffusely.
IMPRESSION: The patient has sustained an obliquely oriented fracture
extending through the distal left humeral metaphysis exiting between the
condyles.

[REDACTED]

## 2015-06-15 IMAGING — CR DG HIP COMPLETE 2+V*L*
1 series · 2 of 2 positions shown · non-contrast
Comparison: none

REASON FOR EXAM: s/p THA
COMMENTS:   Bedside (portable):Y

PROCEDURE:     DXR - DXR HIP LEFT COMPLETE  - February 17, 2013  [DATE]
RESULT:     Patient status post total left hip replacement with good
anatomic alignment. Surgical staples and drains noted over the left hip.

[Series 1: ap · 0.17mm/px · 2 of 2 slices shown]
[im 1/2]
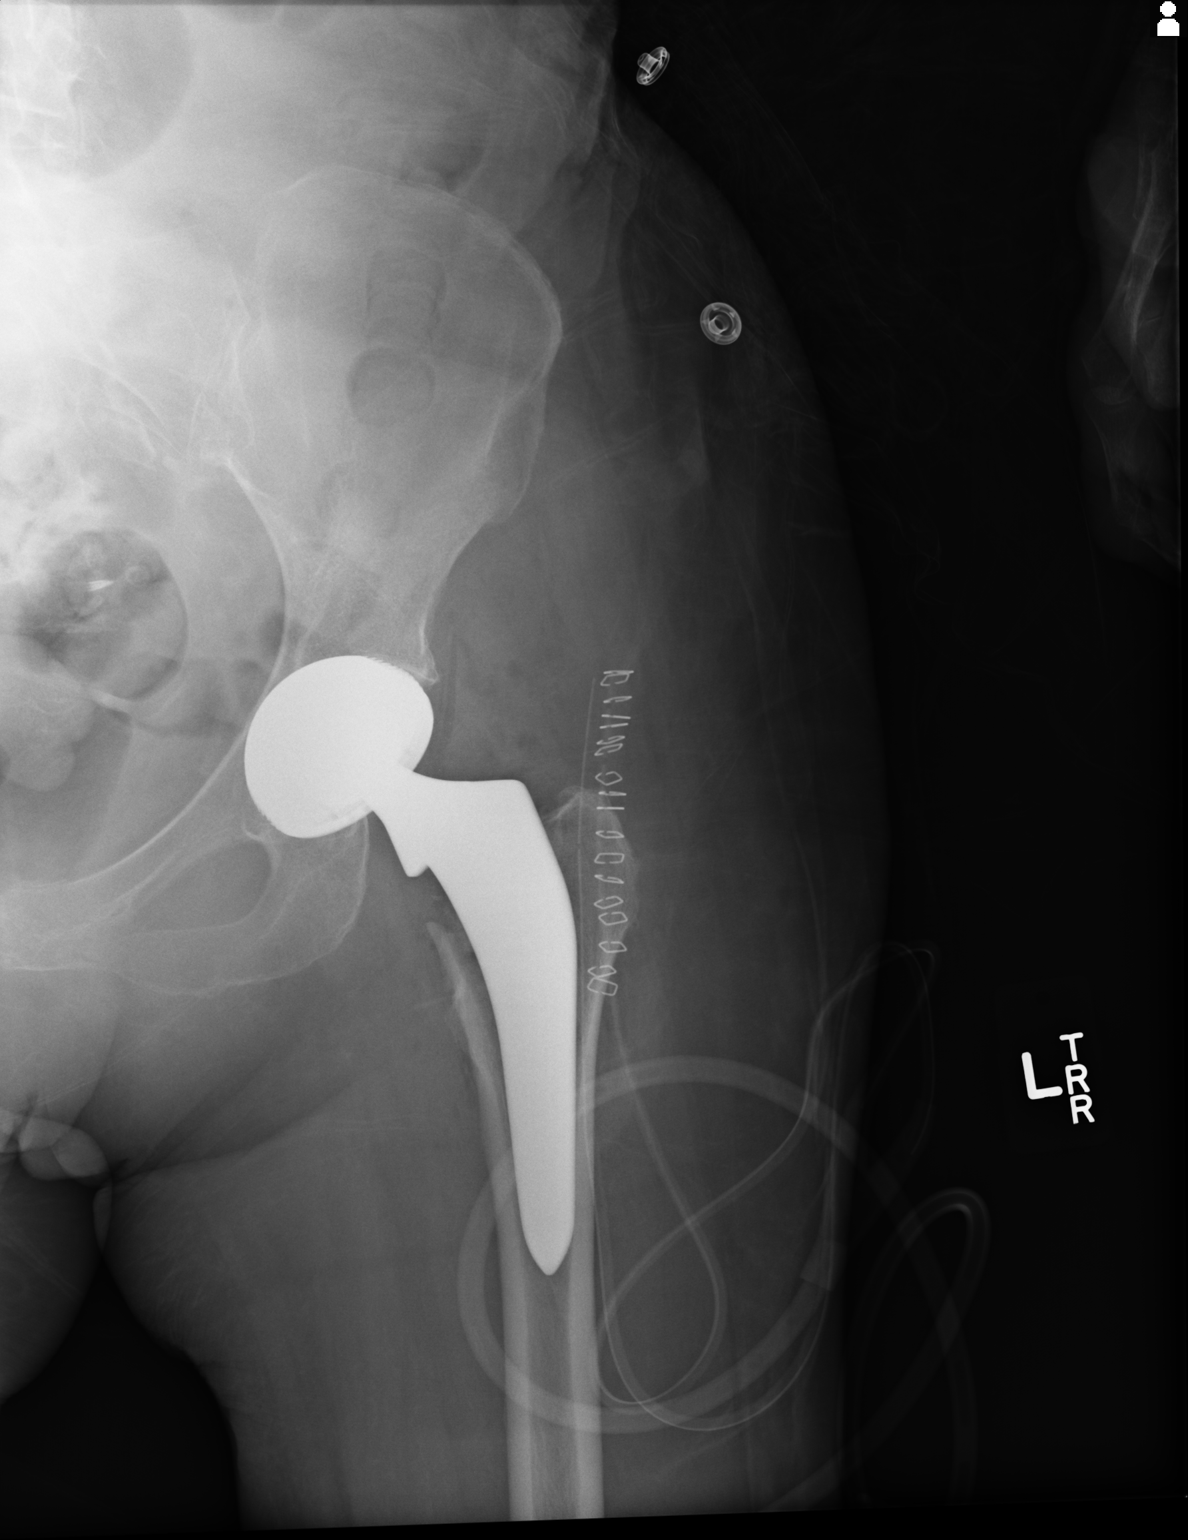
[im 2/2]
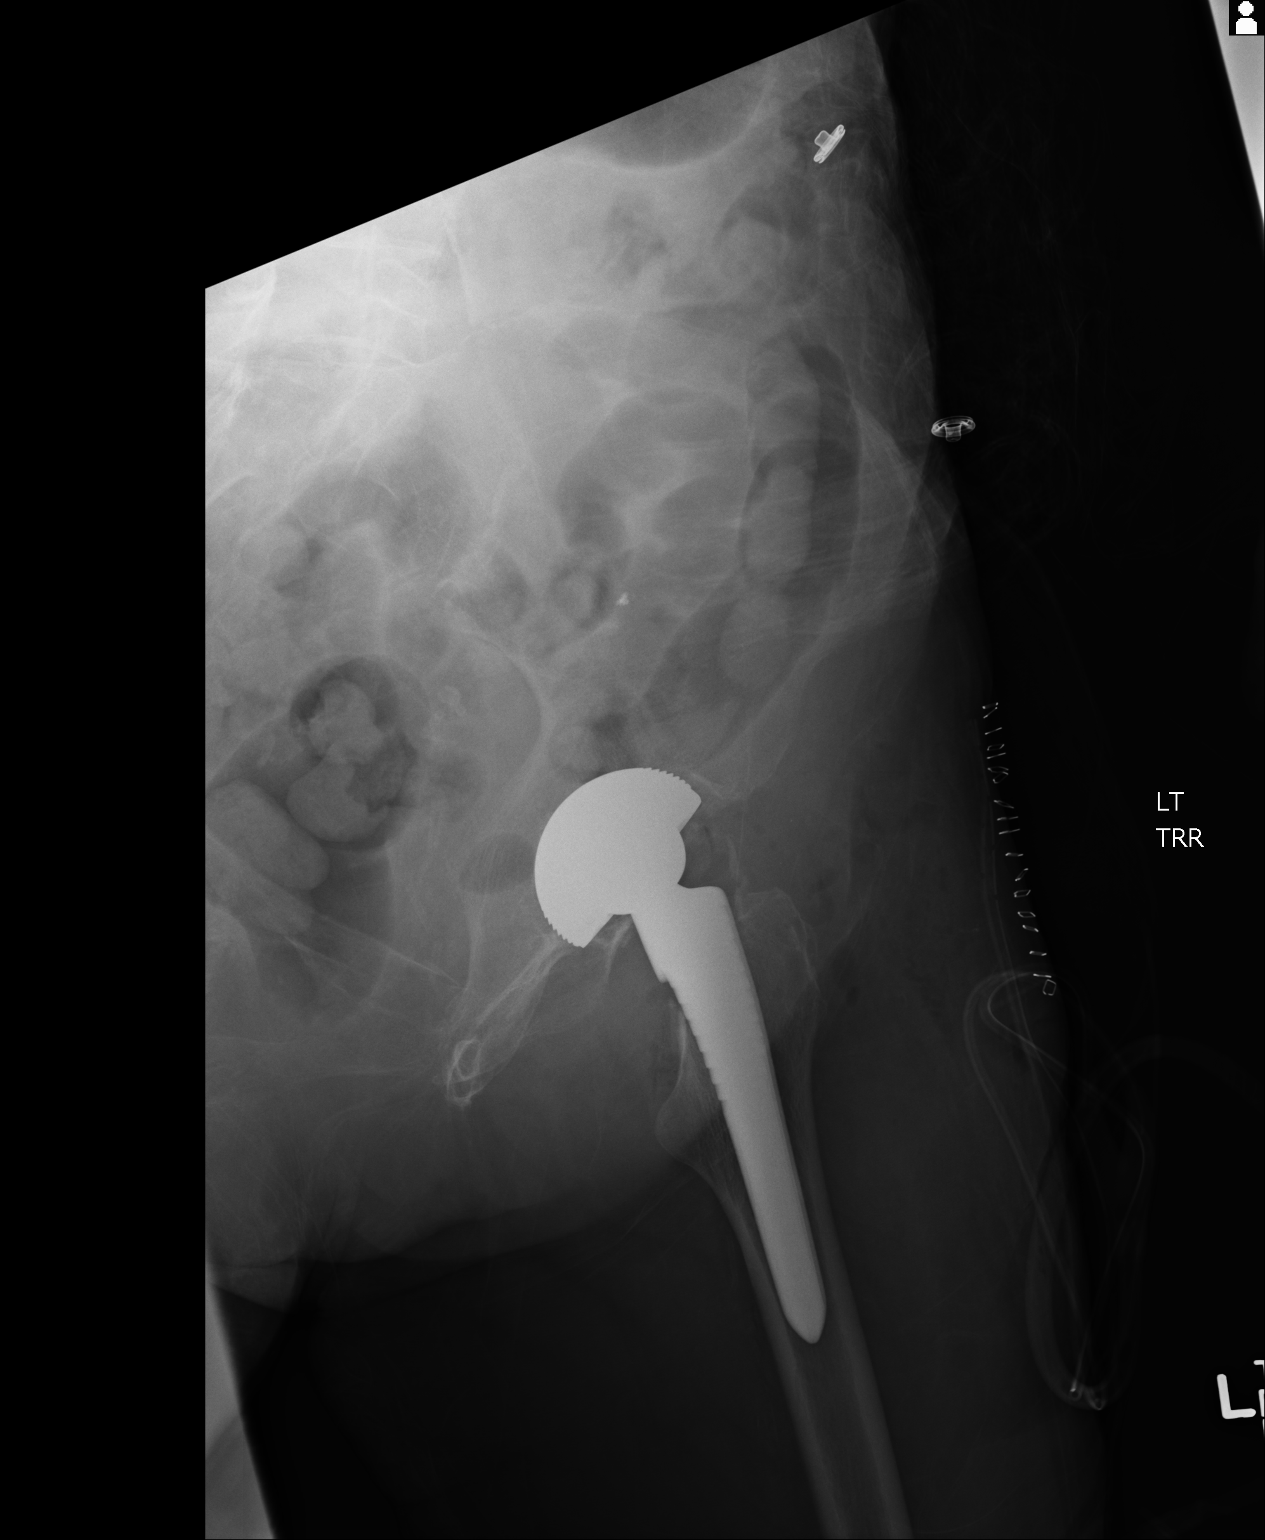

[2 of 2 positions shown; findings below may reference images not displayed]

IMPRESSION: Patient status post total left hip replacement with good
anatomic alignment.

## 2015-06-24 IMAGING — CT CT ANGIO CHEST
1 of 2 series · 18 of 32 positions shown · IV contrast (APPLIED)
Comparison: none

REASON FOR EXAM: call report  [id number]  low stats decreased breath
sounds S P left hip fx  ...
COMMENTS:

PROCEDURE:     CT  - CT ANGIOGRAPHY CHEST W FOR PE  - February 26, 2013  [DATE]
RESULT:     Comparison: CT of the chest [DATE]
TECHNIQUE: Multiple thin section axial images were obtained from the lung
apices to the upper abdomen following 75 ml Isovue 370 intravenous contrast,
according to the PE protocol. These images were also reviewed on a Siemens
multiplanar work station.

[Series 11: soft tissue · axial · 0.55mm/px · z∈[-646,-382]mm · 18 of 96 slices shown]
[im 4/96  lung]
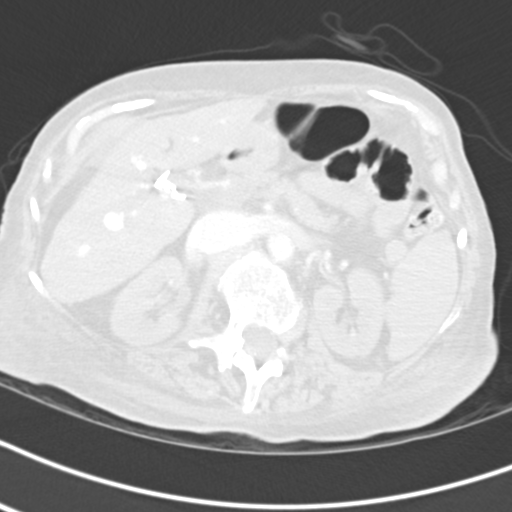
[im 11/96  soft-tissue]
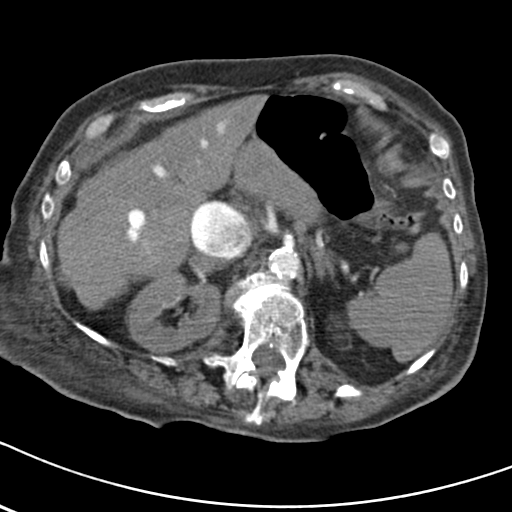
[im 15/96  lung]
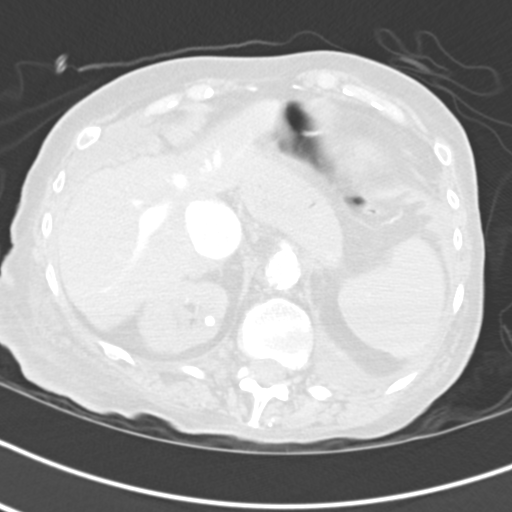
[im 19/96  soft-tissue]
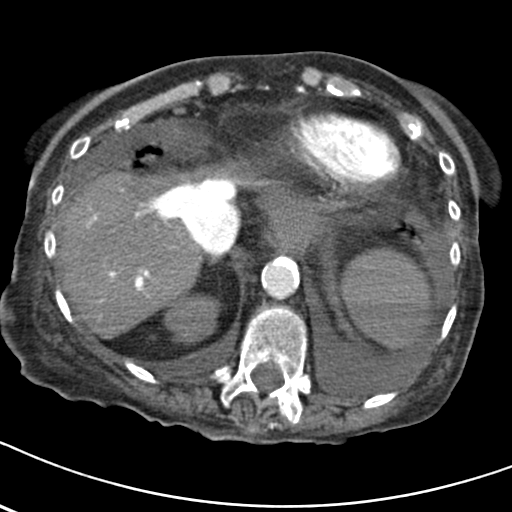
[im 26/96  lung]
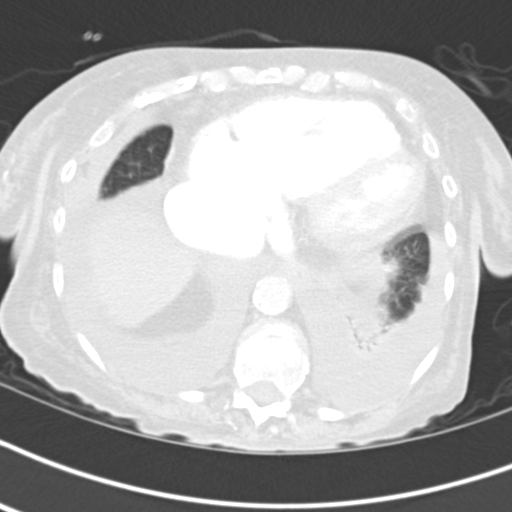
[im 30/96  soft-tissue]
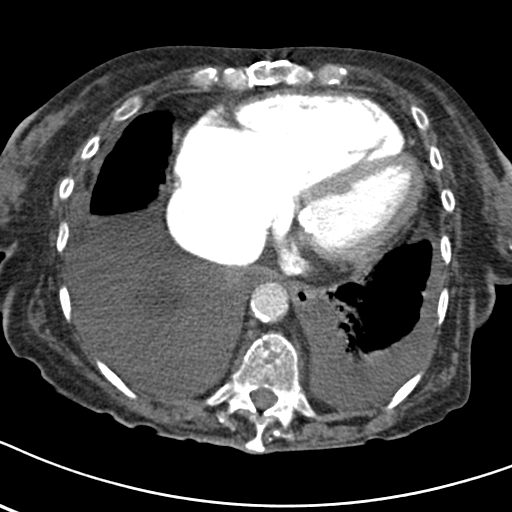
[im 37/96  lung]
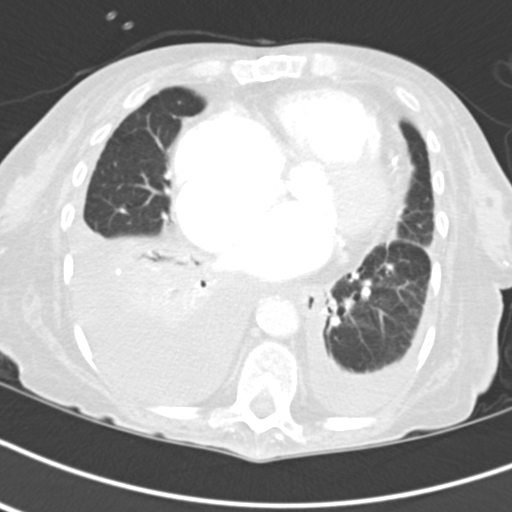
[im 41/96  soft-tissue]
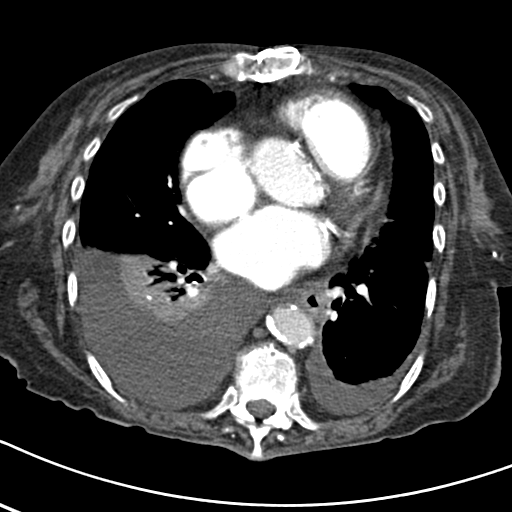
[im 44/96  lung]
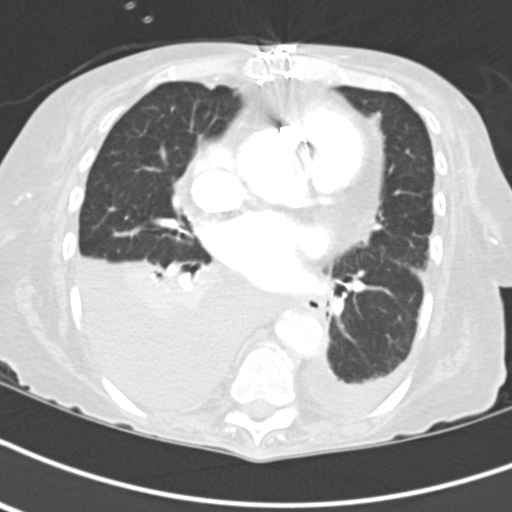
[im 52/96  soft-tissue]
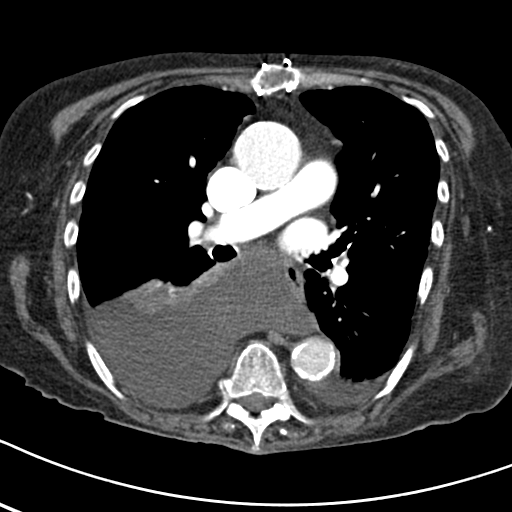
[im 55/96  lung]
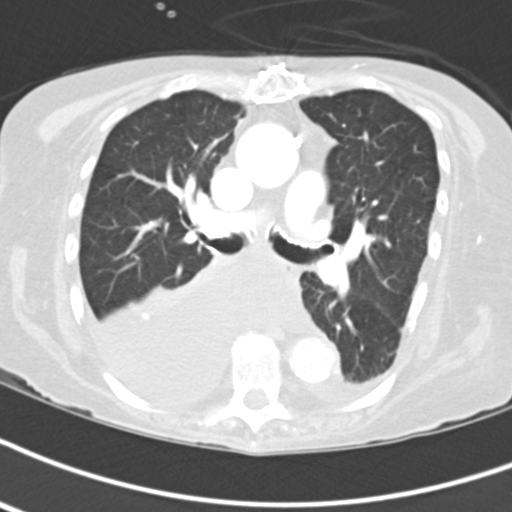
[im 59/96  soft-tissue]
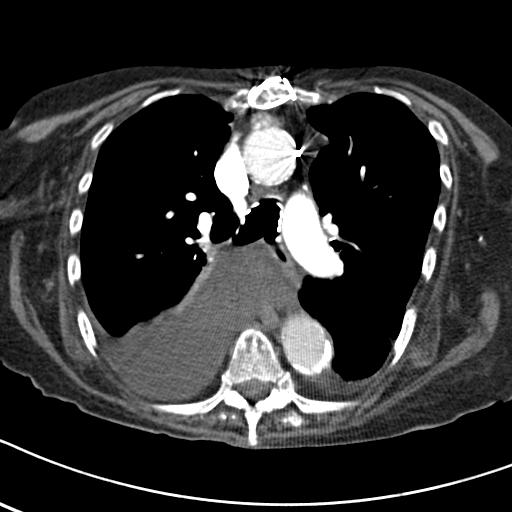
[im 66/96  lung]
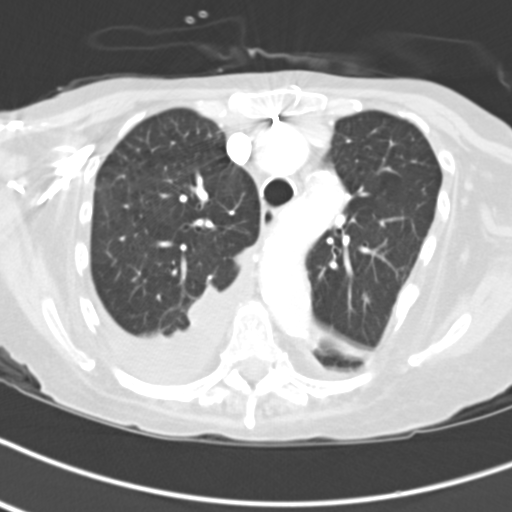
[im 70/96  soft-tissue]
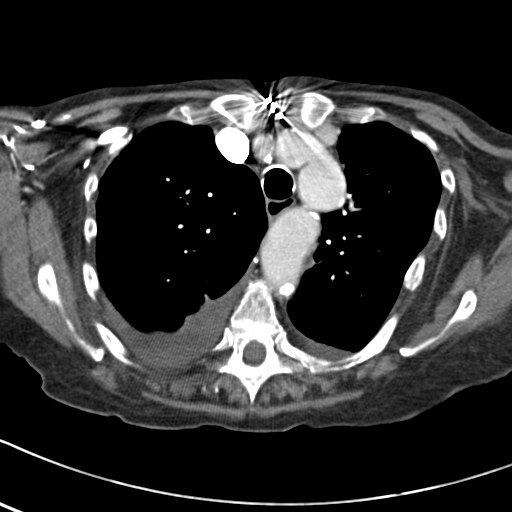
[im 77/96  lung]
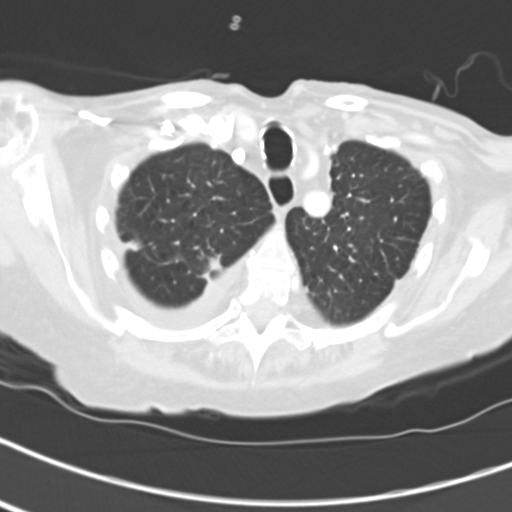
[im 81/96  soft-tissue]
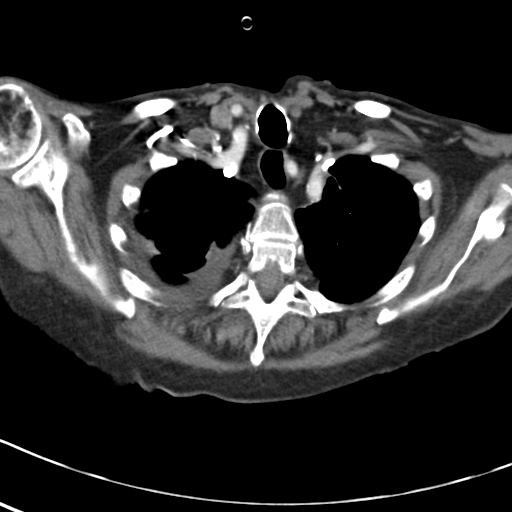
[im 85/96  lung]
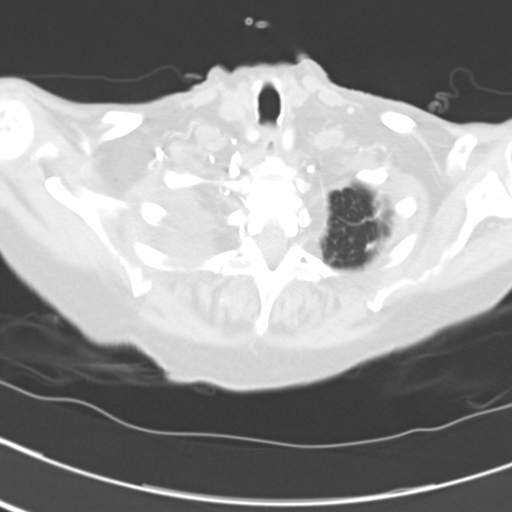
[im 92/96  soft-tissue]
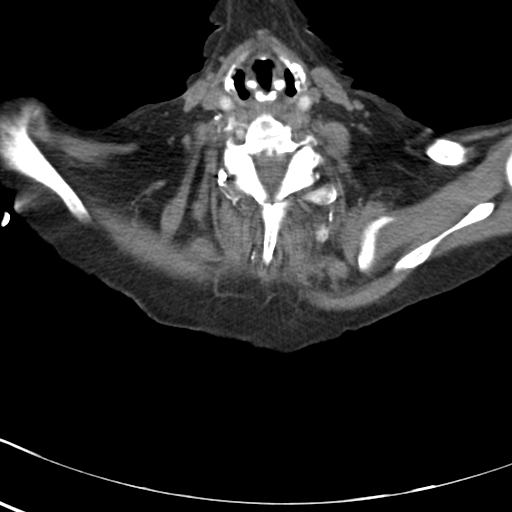

[18 of 32 positions shown; findings below may reference images not displayed]

FINDINGS: No mediastinal, hilar, or axillary lymphadenopathy. Calcifications are seen
in the coronary arteries. Prior median sternotomy. The aorta is tortuous.
There is reflux of contrast material into the hepatic veins suggesting right
heart dysfunction. There is a small to moderate size right pleural effusion.
Small left pleural effusion. A 5 mm nonspecific calcification is seen in the
left kidney. Possible small hiatal hernia.

Evaluation of the segmental pulmonary arteries in the lower lobes is limited
by respiratory motion and atelectasis. Subtle focus of low-attenuation
within a segmental left lower lobe pulmonary artery is felt to be
artifactual. However, a subtle, small segmental pulmonary embolus would be
difficult to exclude.

Bilateral apical subpleural opacities may be secondary to atelectasis, but
these appear more conspicuous than prior. The largest region measures
approximately 9 mm in greatest dimension. The central airways are patent.
Consolidation and volume loss in the right lower lobe is likely secondary to
atelectasis. Underlying parenchymal abnormality is not excluded. Mild left
basilar opacities and consolidation are likely secondary to atelectasis.

No aggressive lytic or sclerotic osseous lesions are identified.
IMPRESSION: 1. No definite pulmonary embolus identified. Evaluation of the segmental
pulmonary arteries is limited. Subtle low attenuation focus in a left lower
lobe segmental pulmonary artery is likely artifactual. However, a subtle
segmental pulmonary embolus would be difficult to exclude.
2. Small to moderate right pleural effusion. Small left pleural effusion.
Atelectasis and consolidation in the lower lobes is likely secondary to
atelectasis. Underlying parenchymal abnormality is not excluded.
3. There is reflux of contrast material into the hepatic veins suggesting
right heart dysfunction.
4. Small subpleural opacities at the lung apices are likely secondary to
scarring. However, given the nodularity a followup noncontrast chest CT is
recommended in 3 months.

This was called to the ordering clinician immediately after the dictation.
